# Patient Record
Sex: Male | Born: 1975 | Race: White | Hispanic: No | Marital: Married | Smoking: Never smoker
Health system: Southern US, Community
[De-identification: ages and names within clinical notes are randomized; demographics above are authoritative.]

## PROBLEM LIST (undated history)

## (undated) DIAGNOSIS — E663 Overweight: Secondary | ICD-10-CM

## (undated) DIAGNOSIS — E7800 Pure hypercholesterolemia, unspecified: Secondary | ICD-10-CM

## (undated) DIAGNOSIS — B356 Tinea cruris: Secondary | ICD-10-CM

## (undated) DIAGNOSIS — N200 Calculus of kidney: Secondary | ICD-10-CM

## (undated) DIAGNOSIS — M898X8 Other specified disorders of bone, other site: Secondary | ICD-10-CM

## (undated) HISTORY — DX: Pure hypercholesterolemia, unspecified: E78.00

## (undated) HISTORY — DX: Tinea cruris: B35.6

## (undated) HISTORY — DX: Calculus of kidney: N20.0

## (undated) HISTORY — DX: Other specified disorders of bone, other site: M89.8X8

## (undated) HISTORY — DX: Overweight: E66.3

---

## 2007-01-26 ENCOUNTER — Emergency Department: Payer: Self-pay | Admitting: Unknown Physician Specialty

## 2008-06-22 ENCOUNTER — Emergency Department: Payer: Self-pay | Admitting: Emergency Medicine

## 2009-02-26 ENCOUNTER — Emergency Department: Payer: Self-pay | Admitting: Emergency Medicine

## 2009-09-03 ENCOUNTER — Emergency Department: Payer: Self-pay | Admitting: Emergency Medicine

## 2011-08-31 ENCOUNTER — Emergency Department: Payer: Self-pay | Admitting: *Deleted

## 2012-03-18 ENCOUNTER — Emergency Department: Payer: Self-pay | Admitting: Emergency Medicine

## 2014-03-29 ENCOUNTER — Emergency Department: Payer: Self-pay | Admitting: Emergency Medicine

## 2014-06-02 ENCOUNTER — Emergency Department
Admit: 2014-06-02 | Disposition: A | Discharge status: Home / Self Care | Payer: Self-pay | Admitting: Emergency Medicine

## 2014-06-02 LAB — CBC WITH DIFFERENTIAL/PLATELET
Basophil #: 0.1 10*3/uL (ref 0.0–0.1)
Basophil %: 0.7 %
Eosinophil #: 0.1 10*3/uL (ref 0.0–0.7)
Eosinophil %: 1 %
HCT: 44.4 % (ref 40.0–52.0)
HGB: 14.9 g/dL (ref 13.0–18.0)
LYMPHS PCT: 15.1 %
Lymphocyte #: 1.6 10*3/uL (ref 1.0–3.6)
MCH: 29.7 pg (ref 26.0–34.0)
MCHC: 33.6 g/dL (ref 32.0–36.0)
MCV: 89 fL (ref 80–100)
Monocyte #: 0.6 x10 3/mm (ref 0.2–1.0)
Monocyte %: 5.8 %
Neutrophil #: 8.3 10*3/uL — ABNORMAL HIGH (ref 1.4–6.5)
Neutrophil %: 77.4 %
PLATELETS: 206 10*3/uL (ref 150–440)
RBC: 5.02 10*6/uL (ref 4.40–5.90)
RDW: 13.1 % (ref 11.5–14.5)
WBC: 10.7 10*3/uL — ABNORMAL HIGH (ref 3.8–10.6)

## 2014-06-02 LAB — URINALYSIS, COMPLETE
Bacteria: NONE SEEN
Bilirubin,UR: NEGATIVE
Blood: NEGATIVE
Glucose,UR: NEGATIVE mg/dL (ref 0–75)
Ketone: NEGATIVE
LEUKOCYTE ESTERASE: NEGATIVE
NITRITE: NEGATIVE
PH: 6 (ref 4.5–8.0)
PROTEIN: NEGATIVE
Specific Gravity: 1.023 (ref 1.003–1.030)
Squamous Epithelial: NONE SEEN

## 2014-06-02 LAB — COMPREHENSIVE METABOLIC PANEL
Albumin: 4.8 g/dL
Alkaline Phosphatase: 89 U/L
Anion Gap: 7 (ref 7–16)
BILIRUBIN TOTAL: 0.7 mg/dL
BUN: 16 mg/dL
CREATININE: 1.02 mg/dL
Calcium, Total: 9.4 mg/dL
Chloride: 107 mmol/L
Co2: 25 mmol/L
EGFR (African American): >60
Glucose: 122 mg/dL — ABNORMAL HIGH
POTASSIUM: 4.3 mmol/L
SGOT(AST): 21 U/L
SGPT (ALT): 18 U/L
Sodium: 139 mmol/L
Total Protein: 8 g/dL

## 2014-06-02 LAB — LIPASE, BLOOD: Lipase: 35 U/L

## 2014-07-09 ENCOUNTER — Encounter: Payer: Self-pay | Admitting: Emergency Medicine

## 2014-07-09 ENCOUNTER — Emergency Department
Admission: EM | Admit: 2014-07-09 | Discharge: 2014-07-09 | Disposition: A | Discharge status: Home / Self Care | Payer: BLUE CROSS/BLUE SHIELD | Attending: Emergency Medicine | Admitting: Emergency Medicine

## 2014-07-09 ENCOUNTER — Emergency Department: Payer: BLUE CROSS/BLUE SHIELD

## 2014-07-09 ENCOUNTER — Other Ambulatory Visit: Payer: Self-pay

## 2014-07-09 DIAGNOSIS — R1011 Right upper quadrant pain: Secondary | ICD-10-CM | POA: Insufficient documentation

## 2014-07-09 LAB — COMPREHENSIVE METABOLIC PANEL
ALT: 15 U/L — ABNORMAL LOW (ref 17–63)
ANION GAP: 6 (ref 5–15)
AST: 19 U/L (ref 15–41)
Albumin: 4.6 g/dL (ref 3.5–5.0)
Alkaline Phosphatase: 84 U/L (ref 38–126)
BUN: 18 mg/dL (ref 6–20)
CALCIUM: 9.3 mg/dL (ref 8.9–10.3)
CO2: 30 mmol/L (ref 22–32)
Chloride: 106 mmol/L (ref 101–111)
Creatinine, Ser: 1.24 mg/dL (ref 0.61–1.24)
GLUCOSE: 110 mg/dL — AB (ref 65–99)
Potassium: 4.7 mmol/L (ref 3.5–5.1)
Sodium: 142 mmol/L (ref 135–145)
Total Bilirubin: 0.4 mg/dL (ref 0.3–1.2)
Total Protein: 7.9 g/dL (ref 6.5–8.1)

## 2014-07-09 LAB — CBC WITH DIFFERENTIAL/PLATELET
BASOS PCT: 1 %
Basophils Absolute: 0.1 10*3/uL (ref 0–0.1)
EOS PCT: 3 %
Eosinophils Absolute: 0.2 10*3/uL (ref 0–0.7)
HEMATOCRIT: 44 % (ref 40.0–52.0)
Hemoglobin: 15 g/dL (ref 13.0–18.0)
Lymphocytes Relative: 26 %
Lymphs Abs: 2.4 10*3/uL (ref 1.0–3.6)
MCH: 30.2 pg (ref 26.0–34.0)
MCHC: 34.1 g/dL (ref 32.0–36.0)
MCV: 88.4 fL (ref 80.0–100.0)
MONO ABS: 0.9 10*3/uL (ref 0.2–1.0)
MONOS PCT: 10 %
Neutro Abs: 5.7 10*3/uL (ref 1.4–6.5)
Neutrophils Relative %: 60 %
PLATELETS: 183 10*3/uL (ref 150–440)
RBC: 4.98 MIL/uL (ref 4.40–5.90)
RDW: 13.4 % (ref 11.5–14.5)
WBC: 9.4 10*3/uL (ref 3.8–10.6)

## 2014-07-09 LAB — LIPASE, BLOOD: LIPASE: 47 U/L (ref 22–51)

## 2014-07-09 LAB — TROPONIN I

## 2014-07-09 MED ORDER — IOHEXOL 300 MG/ML  SOLN
100.0000 mL | Freq: Once | INTRAMUSCULAR | Status: AC | PRN
  Administered 2014-07-09: 100 mL via INTRAVENOUS

## 2014-07-09 MED ORDER — IOHEXOL 240 MG/ML SOLN
25.0000 mL | Freq: Once | INTRAMUSCULAR | Status: AC | PRN
  Administered 2014-07-09: 25 mL via ORAL

## 2014-07-09 MED ORDER — TRAMADOL HCL 50 MG PO TABS: 50.0000 mg | ORAL_TABLET | Freq: Four times a day (QID) | ORAL | Status: AC | PRN

## 2014-07-09 NOTE — ED Notes (Signed)
Pt drinking contrast, tolerating at this time. Will continue to monitor.

## 2014-07-09 NOTE — ED Provider Notes (Signed)
[facility] Emergency Department Provider Note  Time seen: 8:23 AM  I have reviewed the triage vital signs and the nursing notes.   HISTORY  Chief Complaint Abdominal Pain    HPI Anthony Randall is a 39 y.o. male with no past medical history who presents to the emergency department with right upper quadrant abdominal pain. According to the patient for the past 1.5 months he has been having intermittent right upper quadrant pain. Most often occurs after eating, however sometimes including overnight tonight it occurred 6-7 hours after last meal. The patient was seen here twice for this same. He states he had labs and an ultrasound which showed normal results. He denies nausea, vomiting, diarrhea, dysuria. Denies alcohol intake. Denies any worsening with spicy foods or fatty foods. Occasionally he will develop pain after eating but he will also develop pain when he doesn't eat. This morning he states was one of his worst episode so we came back to the emergency department for evaluation. He describes the pain as moderate in severity, dull/aching, located in the right upper quadrant. Occasionally worse with eating. Denies fever.     History reviewed. No pertinent past medical history.  There are no active problems to display for this patient.   History reviewed. No pertinent past surgical history.  No current outpatient prescriptions on file.  Allergies Review of patient's allergies indicates no known allergies.  No family history on file.  Social History History  Substance Use Topics  . Smoking status: Never Smoker   . Smokeless tobacco: Not on file  . Alcohol Use: Not on file    Review of Systems Constitutional: Negative for fever. Cardiovascular: Negative for chest pain. Respiratory: Negative for shortness of breath. Gastrointestinal: Positive for right upper quadrant abdominal pain. Negative for nausea/vomiting/diarrhea. Genitourinary: Negative  for dysuria. Musculoskeletal: Negative for back pain.  10-point ROS otherwise negative.  ____________________________________________   PHYSICAL EXAM:  VITAL SIGNS: ED Triage Vitals  Enc Vitals Group     BP 07/09/14 0820 135/82 mmHg     Pulse Rate 07/09/14 0740 65     Resp 07/09/14 0740 18     Temp 07/09/14 0740 98.3 F (36.8 C)     Temp Source 07/09/14 0740 Oral     SpO2 07/09/14 0740 99 %     Weight 07/09/14 0740 230 lb (104.327 kg)     Height 07/09/14 0740 6\' 2"  (1.88 m)     Head Cir --      Peak Flow --      Pain Score 07/09/14 0740 5     Pain Loc --      Pain Edu? --      Excl. in GC? --     Constitutional: Alert and oriented. Well appearing and in no distress. ENT   Head: Normocephalic and atraumatic.   Mouth/Throat: Mucous membranes are moist. Cardiovascular: Normal rate, regular rhythm. No murmurs Respiratory: Normal respiratory effort without tachypnea nor retractions. Breath sounds are clear Gastrointestinal: Soft, moderate right upper quadrant tenderness to palpation without rebound or guarding. Abdomen otherwise benign. Musculoskeletal: Nontender with normal range of motion in all extremities.  Neurologic:  Normal speech and language. No gross focal neurologic deficits  Skin:  Skin is warm, dry and intact.  Psychiatric: Mood and affect are normal. Speech and behavior are normal.   ____________________________________________    RADIOLOGY  CT abdomen/pelvis within normal limits. No acute abnormality noted.  ____________________________________________   INITIAL IMPRESSION / ASSESSMENT AND PLAN /  ED COURSE  Pertinent labs & imaging results that were available during my care of the patient were reviewed by me and considered in my medical decision making (see chart for details).  39 year old male with no past medical history 1.5 months of intermittent right upper quadrant pain worse last night. We will check labs. Labs are largely within normal  limits. Patient states he had a normal ultrasound 1 month ago and does not wish to repeat an ultrasound at this time. I discussed the risks/benefits of a CT scan, and patient wishes to proceed with a CT scan.  EKG reviewed and interpreted by myself shows normal sinus rhythm at 63 bpm, narrow QRS, normal axis, normal intervals, no ST changes noted. Overall normal EKG.  CT within normal limits, labs within normal limits. I discussed results with the patient and recommended patient follow up with GI medicine for further workup/evaluation including possible HIDA scan. Patient is agreeable to plan.  ____________________________________________   FINAL CLINICAL IMPRESSION(S) / ED DIAGNOSES  Right upper quadrant abdominal pain   Minna Antis, MD 07/09/14 1052

## 2014-07-09 NOTE — ED Notes (Signed)
Patient is resting comfortably. 

## 2014-07-09 NOTE — ED Notes (Signed)
Awoke this AM RUQ abd pain radiating to right flank, denies urinary symptoms  , hx of same approx 1 month ago , increased pain on palpation

## 2014-07-09 NOTE — Discharge Instructions (Signed)

## 2014-07-20 ENCOUNTER — Other Ambulatory Visit: Payer: Self-pay | Admitting: Unknown Physician Specialty

## 2014-07-20 DIAGNOSIS — R1011 Right upper quadrant pain: Secondary | ICD-10-CM

## 2014-07-27 ENCOUNTER — Ambulatory Visit
Admission: RE | Admit: 2014-07-27 | Discharge: 2014-07-27 | Disposition: A | Discharge status: Home / Self Care | Payer: BLUE CROSS/BLUE SHIELD | Source: Ambulatory Visit | Attending: Unknown Physician Specialty | Admitting: Unknown Physician Specialty

## 2014-07-27 DIAGNOSIS — R112 Nausea with vomiting, unspecified: Secondary | ICD-10-CM | POA: Diagnosis not present

## 2014-07-27 DIAGNOSIS — R1011 Right upper quadrant pain: Secondary | ICD-10-CM | POA: Diagnosis present

## 2014-07-27 MED ORDER — SINCALIDE 5 MCG IJ SOLR
0.0200 ug/kg | Freq: Once | INTRAMUSCULAR | Status: AC
  Administered 2014-07-27: 2.09 ug via INTRAVENOUS

## 2014-07-27 MED ORDER — SINCALIDE 5 MCG IJ SOLR
0.0200 ug/kg | Freq: Once | INTRAMUSCULAR | Status: DC
Start: 2014-07-27 — End: 2014-07-28

## 2014-07-27 MED ORDER — TECHNETIUM TC 99M MEBROFENIN IV KIT
5.0000 | PACK | Freq: Once | INTRAVENOUS | Status: AC | PRN
  Administered 2014-07-27: 4.94 via INTRAVENOUS

## 2014-08-12 HISTORY — PX: UPPER GI ENDOSCOPY: SHX6162

## 2014-08-12 HISTORY — PX: COLONOSCOPY: SHX174

## 2015-11-10 IMAGING — NM NM HEPATO W/GB/PHARM/[PERSON_NAME]
3 series · 18 of 18 positions shown · non-contrast
Comparison: None; correlation CT abdomen and pelvis 07/09/2014,
ultrasound abdomen 03/29/2014

CLINICAL DATA: RIGHT upper quadrant pain, nausea and vomiting off
and on since March 2014

EXAM:
NUCLEAR MEDICINE HEPATOBILIARY IMAGING WITH GALLBLADDER EF
TECHNIQUE: Sequential images of the abdomen were obtained [DATE] minutes
following intravenous administration of radiopharmaceutical. After
slow intravenous infusion of 2.09 micrograms Cholecystokinin,
gallbladder ejection fraction was determined.
RADIOPHARMACEUTICALS:  4.94 mCi Kechnetium-EEm Choletec IV

[Series 1000: gallbladder ef dynamic (results) · 4.80mm/px · 6 of 90 frames shown]
[frame 8/90]
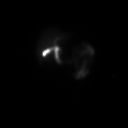
[frame 23/90]
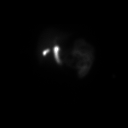
[frame 38/90]
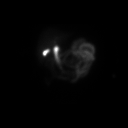
[frame 53/90]
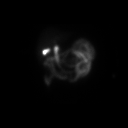
[frame 68/90]
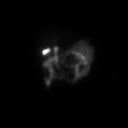
[frame 83/90]
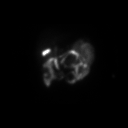

[Series 1000: gallbladder ef dynamic · 4.80mm/px · 6 of 90 frames shown]
[frame 8/90]
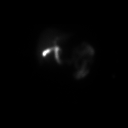
[frame 23/90]
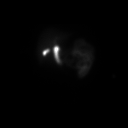
[frame 38/90]
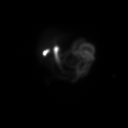
[frame 53/90]
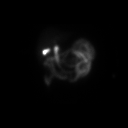
[frame 68/90]
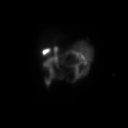
[frame 83/90]
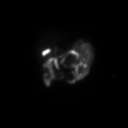

[Series 1000: hepatobiliary dynamic · 9.59mm/px · 6 of 60 frames shown]
[frame 6/60]
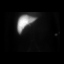
[frame 16/60]
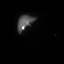
[frame 26/60]
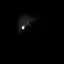
[frame 36/60]
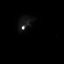
[frame 46/60]
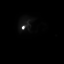
[frame 56/60]
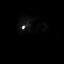

[18 of 18 positions shown; findings below may reference images not displayed]

FINDINGS: Normal tracer extraction from bloodstream indicating normal
hepatocellular function.

Normal excretion of tracer into biliary tree.

Gallbladder visualized at 12 min.

Small bowel visualized at 45 min.

No hepatic retention of tracer.

Subjectively normal emptying of tracer from gallbladder following
CCK administration.

Calculated gallbladder ejection fraction is 63%, normal.

Patient reported no symptoms following CCK administration.
IMPRESSION: Normal exam.

## 2015-12-15 ENCOUNTER — Other Ambulatory Visit: Payer: Self-pay | Admitting: Family Medicine

## 2015-12-15 DIAGNOSIS — R220 Localized swelling, mass and lump, head: Secondary | ICD-10-CM

## 2015-12-20 ENCOUNTER — Ambulatory Visit: Payer: BLUE CROSS/BLUE SHIELD

## 2015-12-21 ENCOUNTER — Ambulatory Visit
Admission: RE | Admit: 2015-12-21 | Discharge: 2015-12-21 | Disposition: A | Discharge status: Home / Self Care | Payer: BLUE CROSS/BLUE SHIELD | Source: Ambulatory Visit | Attending: Family Medicine | Admitting: Family Medicine

## 2015-12-21 DIAGNOSIS — R220 Localized swelling, mass and lump, head: Secondary | ICD-10-CM | POA: Diagnosis present

## 2018-12-01 ENCOUNTER — Other Ambulatory Visit: Payer: Self-pay

## 2018-12-01 ENCOUNTER — Ambulatory Visit: Payer: 59 | Admitting: Physician Assistant

## 2018-12-01 ENCOUNTER — Encounter: Payer: Self-pay | Admitting: Physician Assistant

## 2018-12-01 VITALS — BP 122/93 | HR 70 | Temp 97.7°F | Resp 12 | Ht 74.0 in | Wt 246.0 lb

## 2018-12-01 DIAGNOSIS — Z23 Encounter for immunization: Secondary | ICD-10-CM

## 2018-12-01 DIAGNOSIS — R21 Rash and other nonspecific skin eruption: Secondary | ICD-10-CM

## 2018-12-01 MED ORDER — TERBINAFINE HCL 1 % EX CREA: TOPICAL_CREAM | Freq: Two times a day (BID) | CUTANEOUS | Status: DC

## 2018-12-01 NOTE — Progress Notes (Signed)
Pinkish splotchy rash across chest - been there 1 1/2 -2 months. Tried hydrocortisone & it didn't do anything. States it doesn't itch.  AMD

## 2018-12-01 NOTE — Progress Notes (Signed)
   Subjective:Rash     Patient ID: Anthony Randall, male    DOB: [birth year], 43 y.o.   MRN: [id number]  HPI Patient c/o rash to right upper chest for 2 weeks. Denies itching or pain.   Review of Systems Rash Hyperlipidema     Objective:   Physical Exam Hypo-pigmented macular lesion right upper chest.       Assessment & Plan:  Tinea versicolor. Concertvative treatment consisiting of Lamisil and Selsum shampoo. F/u up 2 week if no improvement.

## 2018-12-02 ENCOUNTER — Telehealth: Payer: Self-pay | Admitting: Registered Nurse

## 2018-12-02 ENCOUNTER — Encounter: Payer: Self-pay | Admitting: Registered Nurse

## 2018-12-02 ENCOUNTER — Telehealth: Payer: Self-pay

## 2018-12-02 DIAGNOSIS — B360 Pityriasis versicolor: Secondary | ICD-10-CM

## 2018-12-02 MED ORDER — TERBINAFINE HCL 1 % EX CREA: 1.0000 "application " | TOPICAL_CREAM | Freq: Two times a day (BID) | CUTANEOUS | 0 refills | Status: DC

## 2018-12-02 NOTE — Telephone Encounter (Signed)
Spoke with Ysidro Evert.  Informed him  Malva Limes, NP-C (Interim Provider) working with [place] today checked the Rx & said it was marked as a "clinic administered medication".  She corrected the Rx & [facility] [address].    AMD

## 2018-12-02 NOTE — Telephone Encounter (Signed)
-----   Message from Madelin Headings sent at 12/02/2018 12:19 PM EDT ----- [facility] went [facility] today and they claim they still don't have the RX.

## 2018-12-02 NOTE — Telephone Encounter (Signed)
Reviewed Epic patient Rx by PA Tamala Julian sent to RN as clinic administered medication instead of to [facility] for lamisil 1% topical BID prn #[id number] RF0.  New Rx sent electronically to his pharmacy of choice [facility] [place], [place]  Please notify patient.

## 2019-09-08 ENCOUNTER — Ambulatory Visit: Payer: 59

## 2019-09-08 ENCOUNTER — Other Ambulatory Visit: Payer: Self-pay

## 2019-09-08 DIAGNOSIS — Z0000 Encounter for general adult medical examination without abnormal findings: Secondary | ICD-10-CM

## 2019-09-08 LAB — POCT URINALYSIS DIPSTICK
Bilirubin, UA: NEGATIVE
Blood, UA: NEGATIVE
Glucose, UA: NEGATIVE
Ketones, UA: NEGATIVE
Leukocytes, UA: NEGATIVE
Nitrite, UA: NEGATIVE
Protein, UA: NEGATIVE
Spec Grav, UA: 1.025 (ref 1.010–1.025)
Urobilinogen, UA: 0.2 E.U./dL
pH, UA: 6 (ref 5.0–8.0)

## 2019-09-08 NOTE — Progress Notes (Signed)
Scheduled to complete physical 09/14/19 with Dr. Williams.  AMD 

## 2019-09-09 LAB — CMP12+LP+TP+TSH+6AC+PSA+CBC…
ALT: 39 IU/L (ref 0–44)
AST: 23 IU/L (ref 0–40)
Albumin/Globulin Ratio: 1.9 (ref 1.2–2.2)
Albumin: 4.7 g/dL (ref 4.0–5.0)
Alkaline Phosphatase: 113 IU/L (ref 48–121)
BUN/Creatinine Ratio: 9 (ref 9–20)
BUN: 10 mg/dL (ref 6–24)
Basophils Absolute: 0.1 10*3/uL (ref 0.0–0.2)
Basos: 1 %
Bilirubin Total: 0.4 mg/dL (ref 0.0–1.2)
Calcium: 9.3 mg/dL (ref 8.7–10.2)
Chloride: 102 mmol/L (ref 96–106)
Chol/HDL Ratio: 4.4 ratio (ref 0.0–5.0)
Cholesterol, Total: 234 mg/dL — ABNORMAL HIGH (ref 100–199)
Creatinine, Ser: 1.14 mg/dL (ref 0.76–1.27)
EOS (ABSOLUTE): 0.3 10*3/uL (ref 0.0–0.4)
Eos: 4 %
Estimated CHD Risk: 0.9 times avg. (ref 0.0–1.0)
Free Thyroxine Index: 2 (ref 1.2–4.9)
GFR calc Af Amer: 89 mL/min/{1.73_m2} (ref >59)
GFR calc non Af Amer: 77 mL/min/{1.73_m2} (ref >59)
GGT: 16 IU/L (ref 0–65)
Globulin, Total: 2.5 g/dL (ref 1.5–4.5)
Glucose: 77 mg/dL (ref 65–99)
HDL: 53 mg/dL (ref >39)
Hematocrit: 42.1 % (ref 37.5–51.0)
Hemoglobin: 14.7 g/dL (ref 13.0–17.7)
Immature Grans (Abs): 0.1 10*3/uL (ref 0.0–0.1)
Immature Granulocytes: 1 %
Iron: 91 ug/dL (ref 38–169)
LDH: 194 IU/L (ref 121–224)
LDL Chol Calc (NIH): 163 mg/dL — ABNORMAL HIGH (ref 0–99)
Lymphocytes Absolute: 2.8 10*3/uL (ref 0.7–3.1)
Lymphs: 36 %
MCH: 30.5 pg (ref 26.6–33.0)
MCHC: 34.9 g/dL (ref 31.5–35.7)
MCV: 87 fL (ref 79–97)
Monocytes Absolute: 0.7 10*3/uL (ref 0.1–0.9)
Monocytes: 9 %
Neutrophils Absolute: 3.7 10*3/uL (ref 1.4–7.0)
Neutrophils: 49 %
Phosphorus: 2.8 mg/dL (ref 2.8–4.1)
Platelets: 182 10*3/uL (ref 150–450)
Potassium: 4.8 mmol/L (ref 3.5–5.2)
Prostate Specific Ag, Serum: 0.7 ng/mL (ref 0.0–4.0)
RBC: 4.82 x10E6/uL (ref 4.14–5.80)
RDW: 12.4 % (ref 11.6–15.4)
Sodium: 139 mmol/L (ref 134–144)
T3 Uptake Ratio: 25 % (ref 24–39)
T4, Total: 8 ug/dL (ref 4.5–12.0)
TSH: 1.72 u[IU]/mL (ref 0.450–4.500)
Total Protein: 7.2 g/dL (ref 6.0–8.5)
Triglycerides: 103 mg/dL (ref 0–149)
Uric Acid: 5.4 mg/dL (ref 3.8–8.4)
VLDL Cholesterol Cal: 18 mg/dL (ref 5–40)
WBC: 7.7 10*3/uL (ref 3.4–10.8)

## 2019-09-14 ENCOUNTER — Ambulatory Visit: Payer: Self-pay | Admitting: Emergency Medicine

## 2019-09-14 ENCOUNTER — Encounter: Payer: Self-pay | Admitting: Emergency Medicine

## 2019-09-14 ENCOUNTER — Other Ambulatory Visit: Payer: Self-pay

## 2019-09-14 VITALS — BP 119/81 | HR 64 | Temp 98.2°F | Resp 14 | Ht 73.0 in | Wt 226.0 lb

## 2019-09-14 DIAGNOSIS — Z0000 Encounter for general adult medical examination without abnormal findings: Secondary | ICD-10-CM

## 2019-09-14 DIAGNOSIS — E785 Hyperlipidemia, unspecified: Secondary | ICD-10-CM

## 2019-09-14 NOTE — Progress Notes (Signed)
Occupational Health Provider Note       Time seen: 10:13 AM    I have reviewed the vital signs and the nursing notes.  HISTORY   Chief Complaint Annual Exam   HPI Anthony Randall is a 44 y.o. male with a history of hyperlipidemia and obesity who presents today for annual physical examination.  Patient denies complaints, denies any recent illness.  He states he has lost weight recently and has increased his exercise regimen when he saw that his cholesterol was elevated.  Past Medical History:  Diagnosis Date  . Elevated LDL cholesterol level   . Kidney stone   . Overweight   . Skull mass   . Tinea cruris     Past Surgical History:  Procedure Laterality Date  . COLONOSCOPY  08/2014  . UPPER GI ENDOSCOPY  08/2014    Allergies Patient has no known allergies.   Review of Systems Constitutional: Negative for fever. Cardiovascular: Negative for chest pain. Respiratory: Negative for shortness of breath. Gastrointestinal: Negative for abdominal pain, vomiting and diarrhea. Musculoskeletal: Negative for back pain. Skin: Negative for rash. Neurological: Negative for headaches, focal weakness or numbness.  All systems negative/normal/unremarkable except as stated in the HPI  ____________________________________________   PHYSICAL EXAM:  VITAL SIGNS: Vitals:   09/14/19 1003  BP: 119/81  Pulse: 64  Resp: 14  Temp: 98.2 F (36.8 C)  SpO2: 98%    Constitutional: Alert and oriented. Well appearing and in no distress. Eyes: Conjunctivae are normal. Normal extraocular movements. ENT      Head: Normocephalic and atraumatic.      Nose: No congestion/rhinnorhea.      Mouth/Throat: Mucous membranes are moist.      Neck: No stridor. Cardiovascular: Normal rate, regular rhythm. No murmurs, rubs, or gallops. Respiratory: Normal respiratory effort without tachypnea nor retractions. Breath sounds are clear and equal bilaterally. No  wheezes/rales/rhonchi. Gastrointestinal: Soft and nontender. Normal bowel sounds Musculoskeletal: Nontender with normal range of motion in extremities. No lower extremity tenderness nor edema. Neurologic:  Normal speech and language. No gross focal neurologic deficits are appreciated.  Skin:  Skin is warm, dry and intact. No rash noted. Psychiatric: Speech and behavior are normal.  ____________________________________________  EKG: Interpreted by me.  Sinus rhythm the rate of 68 bpm, normal PR interval, normal QRS, normal QT  ____________________________________________   LABS (pertinent positives/negatives)  Recent Results (from the past 2160 hour(s))  CMP12+LP+TP+TSH+6AC+PSA+CBC.     Status: Abnormal   Collection Time: 09/08/19  9:33 AM  Result Value Ref Range   Glucose 77 65 - 99 mg/dL   Uric Acid 5.4 3.8 - 8.4 mg/dL    Comment:            Therapeutic target for gout patients: <6.0   BUN 10 6 - 24 mg/dL   Creatinine, Ser 1.14 0.76 - 1.27 mg/dL   GFR calc non Af Amer 77 >59 mL/min/1.73   GFR calc Af Amer 89 >59 mL/min/1.73    Comment: **[facility] currently reports eGFR in compliance with the current**   recommendations of the [employer]. [facility] will   update reporting as new guidelines are published from the NKF-ASN   Task force.    BUN/Creatinine Ratio 9 9 - 20   Sodium 139 134 - 144 mmol/L   Potassium 4.8 3.5 - 5.2 mmol/L   Chloride 102 96 - 106 mmol/L   Calcium 9.3 8.7 - 10.2 mg/dL   Phosphorus 2.8 2.8 - 4.1 mg/dL  Total Protein 7.2 6.0 - 8.5 g/dL   Albumin 4.7 4.0 - 5.0 g/dL   Globulin, Total 2.5 1.5 - 4.5 g/dL   Albumin/Globulin Ratio 1.9 1.2 - 2.2   Bilirubin Total 0.4 0.0 - 1.2 mg/dL   Alkaline Phosphatase 113 48 - 121 IU/L   LDH 194 121 - 224 IU/L   AST 23 0 - 40 IU/L   ALT 39 0 - 44 IU/L   GGT 16 0 - 65 IU/L   Iron 91 38 - 169 ug/dL   Cholesterol, Total 234 (H) 100 - 199 mg/dL   Triglycerides 103 0 - 149 mg/dL   HDL 53 >39 mg/dL   VLDL  Cholesterol Cal 18 5 - 40 mg/dL   LDL Chol Calc (NIH) 163 (H) 0 - 99 mg/dL   Chol/HDL Ratio 4.4 0.0 - 5.0 ratio    Comment:                                   T. Chol/HDL Ratio                                             Men  Women                               1/2 Avg.Risk  3.4    3.3                                   Avg.Risk  5.0    4.4                                2X Avg.Risk  9.6    7.1                                3X Avg.Risk 23.4   11.0    Estimated CHD Risk 0.9 0.0 - 1.0 times avg.    Comment: The CHD Risk is based on the T. Chol/HDL ratio. Other factors affect CHD Risk such as hypertension, smoking, diabetes, severe obesity, and family history of premature CHD.    TSH 1.720 0.450 - 4.500 uIU/mL   T4, Total 8.0 4.5 - 12.0 ug/dL   T3 Uptake Ratio 25 24 - 39 %   Free Thyroxine Index 2.0 1.2 - 4.9   Prostate Specific Ag, Serum 0.7 0.0 - 4.0 ng/mL    Comment: Roche ECLIA methodology. According to the American Urological Association, Serum PSA should decrease and remain at undetectable levels after radical prostatectomy. The AUA defines biochemical recurrence as an initial PSA value 0.2 ng/mL or greater followed by a subsequent confirmatory PSA value 0.2 ng/mL or greater. Values obtained with different assay methods or kits cannot be used interchangeably. Results cannot be interpreted as absolute evidence of the presence or absence of malignant disease.    WBC 7.7 3.4 - 10.8 x10E3/uL   RBC 4.82 4.14 - 5.80 x10E6/uL   Hemoglobin 14.7 13.0 - 17.7 g/dL   Hematocrit 42.1 37.5 - 51.0 %   MCV 87 79 - 97 fL  MCH 30.5 26.6 - 33.0 pg   MCHC 34.9 31 - 35 g/dL   RDW 12.4 11.6 - 15.4 %   Platelets 182 150 - 450 x10E3/uL   Neutrophils 49 Not Estab. %   Lymphs 36 Not Estab. %   Monocytes 9 Not Estab. %   Eos 4 Not Estab. %   Basos 1 Not Estab. %   Neutrophils Absolute 3.7 1 - 7 x10E3/uL   Lymphocytes Absolute 2.8 0 - 3 x10E3/uL   Monocytes Absolute 0.7 0 - 0 x10E3/uL   EOS  (ABSOLUTE) 0.3 0.0 - 0.4 x10E3/uL   Basophils Absolute 0.1 0 - 0 x10E3/uL   Immature Granulocytes 1 Not Estab. %   Immature Grans (Abs) 0.1 0.0 - 0.1 x10E3/uL  POCT urinalysis dipstick     Status: Normal   Collection Time: 09/08/19 10:03 AM  Result Value Ref Range   Color, UA yellow    Clarity, UA clear    Glucose, UA Negative Negative   Bilirubin, UA negative    Ketones, UA negative    Spec Grav, UA 1.025 1.010 - 1.025   Blood, UA negative    pH, UA 6.0 5.0 - 8.0   Protein, UA Negative Negative   Urobilinogen, UA 0.2 0.2 or 1.0 E.U./dL   Nitrite, UA negative    Leukocytes, UA Negative Negative   Appearance dark    Odor      ASSESSMENT AND PLAN  Annual physical examination, hyperlipidemia   Plan: The patient had presented for annual physical examination. Patient's labs were unremarkable with exception of mildly elevated cholesterol.  Patient has increased his diet and exercise regimen, rather than start him on medication, we will continue to monitor.  Lenise Arena MD    Note: This note was generated in part or whole with voice recognition software. Voice recognition is usually quite accurate but there are transcription errors that can and very often do occur. I apologize for any typographical errors that were not detected and corrected.

## 2019-11-02 ENCOUNTER — Other Ambulatory Visit: Payer: Self-pay

## 2019-11-02 DIAGNOSIS — Z1152 Encounter for screening for COVID-19: Secondary | ICD-10-CM

## 2019-11-02 NOTE — Progress Notes (Signed)
Presents to [employer] for outdoor specimen collection for covid testing.  Asymptomatic  Known exposure - son tested positive  Vaccinated  Has mychart  AMD

## 2019-11-04 LAB — SARS-COV-2, NAA 2 DAY TAT

## 2019-11-04 LAB — NOVEL CORONAVIRUS, NAA: SARS-CoV-2, NAA: NOT DETECTED

## 2019-12-22 ENCOUNTER — Other Ambulatory Visit: Payer: Self-pay

## 2019-12-22 NOTE — Progress Notes (Signed)
[facility] specimen ID: [id number]

## 2020-03-09 ENCOUNTER — Encounter: Payer: Self-pay | Admitting: Adult Medicine

## 2020-03-09 ENCOUNTER — Other Ambulatory Visit: Payer: Self-pay

## 2020-03-09 ENCOUNTER — Ambulatory Visit: Payer: Self-pay | Admitting: Adult Medicine

## 2020-03-09 VITALS — BP 120/81 | HR 70 | Temp 98.4°F | Resp 14 | Ht 73.0 in | Wt 225.0 lb

## 2020-03-09 DIAGNOSIS — L700 Acne vulgaris: Secondary | ICD-10-CM

## 2020-03-09 DIAGNOSIS — R21 Rash and other nonspecific skin eruption: Secondary | ICD-10-CM

## 2020-03-09 NOTE — Progress Notes (Signed)
   Subjective:    Patient ID: Anthony Randall, male    DOB: Jun 28, 1975, 45 y.o.   MRN: [id number]  HPI 43yr hx pimple on cheek which occasional get white head and resolves Father had basal cell and client is concerned   Review of Systems Noncontributory with nl 9 system review    Objective:   Physical Exam unremarkable Normotensive O2 sat heent wnl Focus exam skin facial beard Rt cheek 2-22mm red pimple  Pulm clear to ap Cardia nsr no murmer abd soft bs+ no organomegaly Ext full function all limbs Neuro grossly intact      Assessment & Plan:  Bacitracin/zinc, hydrocortisone .5% Rx packet gn. apply thin coat twice daily after cleansing with peroxide 3% daily hx of t. versicolar instructed to wash hand avoid scratching site  observe will Consider dermatology evaluation only if character  changes or non resolution

## 2020-05-12 ENCOUNTER — Other Ambulatory Visit: Payer: Self-pay

## 2020-05-12 ENCOUNTER — Ambulatory Visit: Payer: Self-pay

## 2020-05-12 DIAGNOSIS — Z01818 Encounter for other preprocedural examination: Secondary | ICD-10-CM

## 2020-05-12 LAB — POCT URINALYSIS DIPSTICK
Bilirubin, UA: NEGATIVE
Blood, UA: NEGATIVE
Glucose, UA: NEGATIVE
Ketones, UA: NEGATIVE
Leukocytes, UA: NEGATIVE
Nitrite, UA: NEGATIVE
Protein, UA: POSITIVE — AB
Spec Grav, UA: 1.015
Urobilinogen, UA: 0.2 U/dL
pH, UA: 7

## 2020-05-12 NOTE — Progress Notes (Signed)
Pt scheduled to complete physical 05/17/20

## 2020-05-13 LAB — CMP12+LP+TP+TSH+6AC+PSA+CBC…
ALT: 25 IU/L (ref 0–44)
AST: 28 IU/L (ref 0–40)
Albumin/Globulin Ratio: 1.9 (ref 1.2–2.2)
Albumin: 4.7 g/dL (ref 4.0–5.0)
Alkaline Phosphatase: 110 IU/L (ref 44–121)
BUN/Creatinine Ratio: 12 (ref 9–20)
BUN: 15 mg/dL (ref 6–24)
Basophils Absolute: 0.1 10*3/uL (ref 0.0–0.2)
Basos: 1 %
Bilirubin Total: 0.5 mg/dL (ref 0.0–1.2)
Calcium: 9.4 mg/dL (ref 8.7–10.2)
Chloride: 102 mmol/L (ref 96–106)
Chol/HDL Ratio: 4.3 ratio (ref 0.0–5.0)
Cholesterol, Total: 220 mg/dL — ABNORMAL HIGH (ref 100–199)
Creatinine, Ser: 1.28 mg/dL — ABNORMAL HIGH (ref 0.76–1.27)
EOS (ABSOLUTE): 0.3 10*3/uL (ref 0.0–0.4)
Eos: 4 %
Estimated CHD Risk: 0.8 times avg. (ref 0.0–1.0)
Free Thyroxine Index: 2.1 (ref 1.2–4.9)
GGT: 16 IU/L (ref 0–65)
Globulin, Total: 2.5 g/dL (ref 1.5–4.5)
Glucose: 95 mg/dL (ref 65–99)
HDL: 51 mg/dL (ref >39)
Hematocrit: 42.5 % (ref 37.5–51.0)
Hemoglobin: 14.4 g/dL (ref 13.0–17.7)
Immature Grans (Abs): 0 10*3/uL (ref 0.0–0.1)
Immature Granulocytes: 0 %
Iron: 117 ug/dL (ref 38–169)
LDH: 187 IU/L (ref 121–224)
LDL Chol Calc (NIH): 152 mg/dL — ABNORMAL HIGH (ref 0–99)
Lymphocytes Absolute: 2.7 10*3/uL (ref 0.7–3.1)
Lymphs: 35 %
MCH: 29.9 pg (ref 26.6–33.0)
MCHC: 33.9 g/dL (ref 31.5–35.7)
MCV: 88 fL (ref 79–97)
Monocytes Absolute: 0.8 10*3/uL (ref 0.1–0.9)
Monocytes: 10 %
Neutrophils Absolute: 3.8 10*3/uL (ref 1.4–7.0)
Neutrophils: 50 %
Phosphorus: 3.2 mg/dL (ref 2.8–4.1)
Platelets: 180 10*3/uL (ref 150–450)
Potassium: 4.3 mmol/L (ref 3.5–5.2)
Prostate Specific Ag, Serum: 0.6 ng/mL (ref 0.0–4.0)
RBC: 4.82 x10E6/uL (ref 4.14–5.80)
RDW: 12.4 % (ref 11.6–15.4)
Sodium: 139 mmol/L (ref 134–144)
T3 Uptake Ratio: 24 % (ref 24–39)
T4, Total: 8.6 ug/dL (ref 4.5–12.0)
TSH: 1.33 u[IU]/mL (ref 0.450–4.500)
Total Protein: 7.2 g/dL (ref 6.0–8.5)
Triglycerides: 93 mg/dL (ref 0–149)
Uric Acid: 5.3 mg/dL (ref 3.8–8.4)
VLDL Cholesterol Cal: 17 mg/dL (ref 5–40)
WBC: 7.6 10*3/uL (ref 3.4–10.8)
eGFR: 70 mL/min/{1.73_m2} (ref >59)

## 2020-05-17 ENCOUNTER — Ambulatory Visit: Payer: Self-pay | Admitting: Emergency Medicine

## 2020-05-17 ENCOUNTER — Other Ambulatory Visit: Payer: Self-pay

## 2020-05-17 ENCOUNTER — Encounter: Payer: Self-pay | Admitting: Emergency Medicine

## 2020-05-17 ENCOUNTER — Ambulatory Visit
Admission: RE | Admit: 2020-05-17 | Discharge: 2020-05-17 | Disposition: A | Discharge status: Home / Self Care | Payer: 59 | Source: Ambulatory Visit | Attending: Emergency Medicine | Admitting: Emergency Medicine

## 2020-05-17 VITALS — BP 127/79 | HR 85 | Temp 97.9°F | Resp 14 | Ht 73.0 in | Wt 230.0 lb

## 2020-05-17 DIAGNOSIS — Z0000 Encounter for general adult medical examination without abnormal findings: Secondary | ICD-10-CM

## 2020-05-17 DIAGNOSIS — N50812 Left testicular pain: Secondary | ICD-10-CM | POA: Insufficient documentation

## 2020-05-17 DIAGNOSIS — I861 Scrotal varices: Secondary | ICD-10-CM | POA: Diagnosis not present

## 2020-05-17 NOTE — Progress Notes (Signed)
I have reviewed the triage vital signs and the nursing notes.   HISTORY  Chief Complaint Annual Exam   HPI Anthony Randall is a 45 y.o. male presents for an annual physical.  Patient has a history of various cancers in his family including throat cancer, lung cancer and prostate cancer.  He reports that while at the gym he had an episode of less testicular pain posteriorly.  He denies any swelling or persistent pain.  He denies any dysuria or previous history of hernias.  PSAs were reviewed.      Past Medical History:  Diagnosis Date  . Elevated LDL cholesterol level   . Kidney stone   . Overweight   . Skull mass   . Tinea cruris     Patient Active Problem List   Diagnosis Date Noted  . Rash 12/01/2018    Past Surgical History:  Procedure Laterality Date  . COLONOSCOPY  08/2014  . UPPER GI ENDOSCOPY  08/2014    Prior to Admission medications   Medication Sig Start Date End Date Taking? Authorizing Provider  terbinafine (LAMISIL AT) 1 % cream Apply 1 application topically 2 (two) times daily. 12/02/18  Yes Betancourt, Jarold Song, NP    Allergies Patient has no known allergies.  History reviewed. No pertinent family history.  Social History Social History   Tobacco Use  . Smoking status: Never Smoker  . Smokeless tobacco: Never Used  Substance Use Topics  . Alcohol use: Yes    Comment: ocassionally  . Drug use: Never    Review of Systems Constitutional: No fever/chills Eyes: No visual changes. ENT: No complaints. Cardiovascular: Denies chest pain. Respiratory: Denies shortness of breath. Gastrointestinal: No abdominal pain.  No nausea, no vomiting.  No diarrhea.  No constipation. Genitourinary: Negative for dysuria.  Positive for isolated left testicular pain. Musculoskeletal: Negative for musculoskeletal pain. Skin: Negative for rash. Neurological: Negative for headaches, focal weakness or  numbness. ____________________________________________   PHYSICAL EXAM:  VITAL SIGNS: Constitutional: Alert and oriented. Well appearing and in no acute distress. Eyes: Conjunctivae are normal. PERRL. EOMI. Head: Atraumatic. Nose: No congestion/rhinnorhea. Neck: No stridor.  No cervical tenderness on palpation posteriorly.  Range of motion without restriction. Cardiovascular: Normal rate, regular rhythm. Grossly normal heart sounds.  Good peripheral circulation. Respiratory: Normal respiratory effort.  No retractions. Lungs CTAB. Gastrointestinal: Soft and nontender. No distention.  Bowel sounds normoactive x4 quadrants. Musculoskeletal: Nontender thoracic and lumbar spine.  Patient is able move upper and lower extremities without any difficulty.  No edema noted lower extremities.  Good muscle strength bilaterally.  Normal gait was noted. Neurologic:  Normal speech and language. No gross focal neurologic deficits are appreciated. No gait instability. Skin:  Skin is warm, dry and intact. No rash noted. Psychiatric: Mood and affect are normal. Speech and behavior are normal.  ____________________________________________   LABS (all labs ordered are listed, but only abnormal results are displayed)  Labs were reviewed. ____________________________________________  EKG  EKG normal sinus rhythm with a ventricular rate 71. ____________________________________________    INITIAL IMPRESSION / ASSESSMENT AND PLAN   Isolated episode of left testicular pain.  Positive family history of various cancers.  Will obtain a ultrasound scrotum with Doppler to evaluate and reassure patient. ____________________________________________   FINAL CLINICAL IMPRESSION(S)   Annual physical. History of isolated episode of left testicular pain to be evaluated.   ED Discharge Orders    None       Note:  This document was prepared using  [employer] and may include  unintentional dictation errors.

## 2020-05-22 ENCOUNTER — Other Ambulatory Visit: Payer: Self-pay

## 2020-05-22 NOTE — Progress Notes (Signed)
Lab [employer] ID: [id number]

## 2021-05-17 ENCOUNTER — Ambulatory Visit: Payer: Self-pay

## 2021-05-17 DIAGNOSIS — Z0000 Encounter for general adult medical examination without abnormal findings: Secondary | ICD-10-CM

## 2021-05-17 LAB — POCT URINALYSIS DIPSTICK
Bilirubin, UA: NEGATIVE
Blood, UA: NEGATIVE
Glucose, UA: NEGATIVE
Ketones, UA: NEGATIVE
Leukocytes, UA: NEGATIVE
Nitrite, UA: NEGATIVE
Protein, UA: NEGATIVE
Spec Grav, UA: 1.02 (ref 1.010–1.025)
Urobilinogen, UA: 0.2 E.U./dL
pH, UA: 6.5 (ref 5.0–8.0)

## 2021-05-17 NOTE — Progress Notes (Signed)
Pt presents today for physical labs, will return to clinic for scheduled physical.  

## 2021-05-18 LAB — CMP12+LP+TP+TSH+6AC+PSA+CBC…
ALT: 21 IU/L (ref 0–44)
AST: 24 IU/L (ref 0–40)
Albumin/Globulin Ratio: 1.9 (ref 1.2–2.2)
Albumin: 5.1 g/dL — ABNORMAL HIGH (ref 4.0–5.0)
Alkaline Phosphatase: 105 IU/L (ref 44–121)
BUN/Creatinine Ratio: 12 (ref 9–20)
BUN: 13 mg/dL (ref 6–24)
Basophils Absolute: 0.1 10*3/uL (ref 0.0–0.2)
Basos: 1 %
Bilirubin Total: 0.6 mg/dL (ref 0.0–1.2)
Calcium: 10.3 mg/dL — ABNORMAL HIGH (ref 8.7–10.2)
Chloride: 103 mmol/L (ref 96–106)
Chol/HDL Ratio: 4.7 ratio (ref 0.0–5.0)
Cholesterol, Total: 242 mg/dL — ABNORMAL HIGH (ref 100–199)
Creatinine, Ser: 1.13 mg/dL (ref 0.76–1.27)
EOS (ABSOLUTE): 0.3 10*3/uL (ref 0.0–0.4)
Eos: 4 %
Estimated CHD Risk: 1 times avg. (ref 0.0–1.0)
Free Thyroxine Index: 2 (ref 1.2–4.9)
GGT: 15 IU/L (ref 0–65)
Globulin, Total: 2.7 g/dL (ref 1.5–4.5)
Glucose: 107 mg/dL — ABNORMAL HIGH (ref 70–99)
HDL: 51 mg/dL (ref >39)
Hematocrit: 42 % (ref 37.5–51.0)
Hemoglobin: 14.4 g/dL (ref 13.0–17.7)
Immature Grans (Abs): 0 10*3/uL (ref 0.0–0.1)
Immature Granulocytes: 0 %
Iron: 123 ug/dL (ref 38–169)
LDH: 191 IU/L (ref 121–224)
LDL Chol Calc (NIH): 176 mg/dL — ABNORMAL HIGH (ref 0–99)
Lymphocytes Absolute: 2.5 10*3/uL (ref 0.7–3.1)
Lymphs: 38 %
MCH: 29.9 pg (ref 26.6–33.0)
MCHC: 34.3 g/dL (ref 31.5–35.7)
MCV: 87 fL (ref 79–97)
Monocytes Absolute: 0.7 10*3/uL (ref 0.1–0.9)
Monocytes: 11 %
Neutrophils Absolute: 3.1 10*3/uL (ref 1.4–7.0)
Neutrophils: 46 %
Phosphorus: 3.6 mg/dL (ref 2.8–4.1)
Platelets: 191 10*3/uL (ref 150–450)
Potassium: 4.6 mmol/L (ref 3.5–5.2)
Prostate Specific Ag, Serum: 0.5 ng/mL (ref 0.0–4.0)
RBC: 4.81 x10E6/uL (ref 4.14–5.80)
RDW: 12.6 % (ref 11.6–15.4)
Sodium: 144 mmol/L (ref 134–144)
T3 Uptake Ratio: 25 % (ref 24–39)
T4, Total: 8.1 ug/dL (ref 4.5–12.0)
TSH: 2.27 u[IU]/mL (ref 0.450–4.500)
Total Protein: 7.8 g/dL (ref 6.0–8.5)
Triglycerides: 84 mg/dL (ref 0–149)
Uric Acid: 5.7 mg/dL (ref 3.8–8.4)
VLDL Cholesterol Cal: 15 mg/dL (ref 5–40)
WBC: 6.7 10*3/uL (ref 3.4–10.8)
eGFR: 81 mL/min/{1.73_m2} (ref >59)

## 2021-05-24 ENCOUNTER — Ambulatory Visit: Payer: Self-pay | Admitting: Physician Assistant

## 2021-05-24 ENCOUNTER — Encounter: Payer: Self-pay | Admitting: Physician Assistant

## 2021-05-24 VITALS — BP 140/87 | HR 84 | Temp 98.4°F | Resp 14 | Ht 73.0 in | Wt 243.0 lb

## 2021-05-24 DIAGNOSIS — Z0000 Encounter for general adult medical examination without abnormal findings: Secondary | ICD-10-CM

## 2021-05-24 DIAGNOSIS — E785 Hyperlipidemia, unspecified: Secondary | ICD-10-CM

## 2021-05-24 MED ORDER — ATORVASTATIN CALCIUM 40 MG PO TABS: 40.0000 mg | ORAL_TABLET | Freq: Every day | ORAL | 3 refills | Status: DC

## 2021-05-24 NOTE — Progress Notes (Signed)
?[facility] ? ? ? ? ?____________________________________________ ? ? None  ?  (approximate) ? ?I have reviewed the triage vital signs and the nursing notes. ? ? ?HISTORY ? ?Chief Complaint ?Annual Exam ? ? ? ?HPI ?Anthony Randall is a 46 y.o. male patient presents annual physical exam.  Patient voiced concern for intimating left lower back pain.  Denies radicular component to back pain.  Denies bladder or bowel dysfunction.  Patient has history of elevated LDL cholesterol levels. ?   ? ?  ? ? ?Past Medical History:  ?Diagnosis Date  ? Elevated LDL cholesterol level   ? Kidney stone   ? Overweight   ? Skull mass   ? Tinea cruris   ? ? ?Patient Active Problem List  ? Diagnosis Date Noted  ? Rash 12/01/2018  ? ? ?Past Surgical History:  ?Procedure Laterality Date  ? COLONOSCOPY  08/2014  ? UPPER GI ENDOSCOPY  08/2014  ? ? ?Prior to Admission medications   ?Medication Sig Start Date End Date Taking? Authorizing Provider  ?terbinafine (LAMISIL AT) 1 % cream Apply 1 application topically 2 (two) times daily. 12/02/18  Yes Betancourt, Aura Fey, NP  ? ? ?Allergies ?Patient has no known allergies. ? ?History reviewed. No pertinent family history. ? ?Social History ?Social History  ? ?Tobacco Use  ? Smoking status: Never  ? Smokeless tobacco: Never  ?Substance Use Topics  ? Alcohol use: Yes  ?  Comment: ocassionally  ? Drug use: Never  ? ? ?Review of Systems ?Constitutional: No fever/chills ?Eyes: No visual changes. ?ENT: No sore throat. ?Cardiovascular: Denies chest pain. ?Respiratory: Denies shortness of breath. ?Gastrointestinal: No abdominal pain.  No nausea, no vomiting.  No diarrhea.  No constipation. ?Genitourinary: Negative for dysuria. ?Musculoskeletal: Positive for back pain. ?Skin: Negative for rash. ?Neurological: Negative for headaches, focal weakness or numbness. ?____________________________________________ ? ? ?PHYSICAL EXAM: ? ?VITAL SIGNS: Temperature 98.4, respiration 14, pulse  84, BP is 140/87, and patient is 96% O2 sat on room air.  Patient weighs 243 pounds and BMI is 32.06. ?Constitutional: Alert and oriented. Well appearing and in no acute distress. ?Eyes: Conjunctivae are normal. PERRL. EOMI. ?Head: Atraumatic. ?Nose: No congestion/rhinnorhea. ?Mouth/Throat: Mucous membranes are moist.  Oropharynx non-erythematous. ?Neck: No stridor.  No cervical spine tenderness to palpation. ?Hematological/Lymphatic/Immunilogical: No cervical lymphadenopathy. ?Cardiovascular: Normal rate, regular rhythm. Grossly normal heart sounds.  Good peripheral circulation. ?Respiratory: Normal respiratory effort.  No retractions. Lungs CTAB. ?Gastrointestinal: Soft and nontender. No distention. No abdominal bruits. No CVA tenderness. ?Genitourinary: Deferred ?Musculoskeletal: No lower extremity tenderness nor edema.  No joint effusions. ?Neurologic:  Normal speech and language. No gross focal neurologic deficits are appreciated. No gait instability. ?Skin:  Skin is warm, dry and intact. No rash noted. ?Psychiatric: Mood and affect are normal. Speech and behavior are normal. ? ?____________________________________________ ?  ?LABS ? ?___ ?      ?Component Ref Range & Units 7 d ago ?(05/17/21) 1 yr ago ?(05/12/20) 1 yr ago ?(09/08/19)  ?Color, UA  Light Yellow  yellow  yellow   ?Clarity, UA  Clear  clear  clear   ?Glucose, UA Negative Negative  Negative  Negative   ?Bilirubin, UA  Negative  negative  negative   ?Ketones, UA  Negative  negative  negative   ?Spec Grav, UA 1.010 - 1.025 1.020  1.015  1.025   ?Blood, UA  Negative  negative  negative   ?pH, UA 5.0 - 8.0 6.5  7.0  6.0   ?  Protein, UA Negative Negative  Positive Abnormal  CM  Negative   ?Urobilinogen, UA 0.2 or 1.0 E.U./dL 0.2  0.2  0.2   ?Nitrite, UA  Negative  negative  negative   ?Leukocytes, UA Negative Negative  Negative  Negative   ?Appearance   light  dark   ?Odor        ?  ? ?  ?  ?Specimen Collected: 05/17/21 09:11 Last Resulted: 05/17/21 09:11  ?   ?  Lab Flowsheet   ? Order Details   ? View Encounter   ? Lab and Collection Details   ? Routing   ? Result History    ?View Encounter Conversation    ?  ?CM=Additional comments    ?  ?Result Care Coordination ? ? ?Patient Communication ? ? Add Comments   Seen Back to Top  ?  ?  ? ?Other Results from 05/17/2021 ? ? Contains abnormal data CMP12+LP+TP+TSH+6AC+PSA+CBC? ?Order: [id number] ?Status: Final result    ?Visible to patient: Yes (seen)    ?Next appt: None    ?Dx: Routine adult health maintenance    ?0 Result Notes ?          ?Component Ref Range & Units 7 d ago ?(05/17/21) 1 yr ago ?(05/12/20) 1 yr ago ?(09/08/19) 6 yr ago ?(07/09/14) 6 yr ago ?(07/09/14) 6 yr ago ?(06/02/14) 6 yr ago ?(06/02/14)  ?Glucose 70 - 99 mg/dL 107 High   95 R  77 R  110 High  R   122 High  R, CM    ?Uric Acid 3.8 - 8.4 mg/dL 5.7  5.3 CM  5.4 CM       ?Comment:            Therapeutic target for gout patients: <6.0  ?BUN 6 - 24 mg/dL 13  15  10  18 R   16 R, CM    ?Creatinine, Ser 0.76 - 1.27 mg/dL 1.13  1.28 High   1.14  1.24 R   1.02 R, CM    ?eGFR >59 mL/min/1.73 81  70        ?BUN/Creatinine Ratio 9 - 20 12  12  9       ?Sodium 134 - 144 mmol/L 144  139  139  142 R   139 R, CM    ?Potassium 3.5 - 5.2 mmol/L 4.6  4.3  4.8  4.7 R   4.3 R, CM    ?Chloride 96 - 106 mmol/L 103  102  102  106 R   107 R, CM    ?Calcium 8.7 - 10.2 mg/dL 10.3 High   9.4  9.3  9.3 R   9.4 R, CM    ?Phosphorus 2.8 - 4.1 mg/dL 3.6  3.2  2.8       ?Total Protein 6.0 - 8.5 g/dL 7.8  7.2  7.2  7.9 R   8.0 R, CM    ?Albumin 4.0 - 5.0 g/dL 5.1 High   4.7  4.7  4.6 R   4.8 R, CM    ?Globulin, Total 1.5 - 4.5 g/dL 2.7  2.5  2.5       ?Albumin/Globulin Ratio 1.2 - 2.2 1.9  1.9  1.9       ?Bilirubin Total 0.0 - 1.2 mg/dL 0.6  0.5  0.4  0.4 R   0.7 R, CM    ?Alkaline Phosphatase 44 - 121 IU/L 105  110  113 R  84 R   89   R, CM    ?LDH 121 - 224 IU/L 191  187  194       ?AST 0 - 40 IU/L 24  28  23  19 R   21 R, CM    ?ALT 0 - 44 IU/L 21  25  39  15 Low  R   18 R, CM    ?GGT 0 - 65  IU/L 15  16  16       ?Iron 38 - 169 ug/dL 123  117  91       ?Cholesterol, Total 100 - 199 mg/dL 242 High   220 High   234 High        ?Triglycerides 0 - 149 mg/dL 84  93  103       ?HDL >39 mg/dL 51  51  53       ?VLDL Cholesterol Cal 5 - 40 mg/dL 15  17  18       ?LDL Chol Calc (NIH) 0 - 99 mg/dL 176 High   152 High   163 High        ?Chol/HDL Ratio 0.0 - 5.0 ratio 4.7  4.3 CM  4.4 CM       ?Comment:                                   T. Chol/HDL Ratio  ?                                            Men  Women  ?                              1/2 Avg.Risk  3.4    3.3  ?                                  Avg.Risk  5.0    4.4  ?                               2X Avg.Risk  9.6    7.1  ?                               3X Avg.Risk 23.4   11.0   ?Estimated CHD Risk 0.0 - 1.0 times avg. 1.0  0.8 CM  0.9 CM       ?Comment: The CHD Risk is based on the T. Chol/HDL ratio. Other  ?factors affect CHD Risk such as hypertension, smoking,  ?diabetes, severe obesity, and family history of  ?premature CHD.   ?TSH 0.450 - 4.500 uIU/mL 2.270  1.330  1.720       ?T4, Total 4.5 - 12.0 ug/dL 8.1  8.6  8.0       ?T3 Uptake Ratio 24 - 39 % 25  24  25       ?Free Thyroxine Index 1.2 - 4.9 2.0  2.1  2.0       ?Prostate Specific Ag, Serum 0.0 - 4.0 ng/mL 0.5  0.6 CM  0.7 CM       ?  Comment: Roche ECLIA methodology.  ?According to the American Urological Association, Serum PSA should  ?decrease and remain at undetectable levels after radical  ?prostatectomy. The AUA defines biochemical recurrence as an initial  ?PSA value 0.2 ng/mL or greater followed by a subsequent confirmatory  ?PSA value 0.2 ng/mL or greater.  ?Values obtained with different assay methods or kits cannot be used  ?interchangeably. Results cannot be interpreted as absolute evidence  ?of the presence or absence of malignant disease.   ?WBC 3.4 - 10.8 x10E3/uL 6.7  7.6  7.7   9.4 R   10.7 High  R   ?RBC 4.14 - 5.80 x10E6/uL 4.81  4.82  4.82   4.98 R   5.02 R   ?Hemoglobin 13.0 -  17.7 g/dL 14.4  14.4  14.7   15.0 R     ?Hematocrit 37.5 - 51.0 % 42.0  42.5  42.1   44.0 R     ?MCV 79 - 97 fL 87  88  87   88.4 R   89 R   ?MCH 26.6 - 33.0 pg 29.9  29.9  30.5   30.2 R   29.7 R   ?MCHC 3

## 2021-05-24 NOTE — Progress Notes (Signed)
Pt presents today to complete physical. Pt denies any issues or concerns at this time./CL,RMA 

## 2021-07-20 DIAGNOSIS — H524 Presbyopia: Secondary | ICD-10-CM | POA: Diagnosis not present

## 2021-08-31 IMAGING — US US SCROTUM W/ DOPPLER COMPLETE
1 series · 14 of 25 positions shown · non-contrast
Comparison: None.

CLINICAL DATA: Acute left testicular pain.

EXAM:
SCROTAL ULTRASOUND
DOPPLER ULTRASOUND OF THE TESTICLES
TECHNIQUE: Complete ultrasound examination of the testicles, epididymis, and
other scrotal structures was performed. Color and spectral Doppler
ultrasound were also utilized to evaluate blood flow to the
testicles.

[Series 1: us scrotum w/ doppler complete · 0.08mm/px · 14 of 51 slices shown]
[im 1/51]
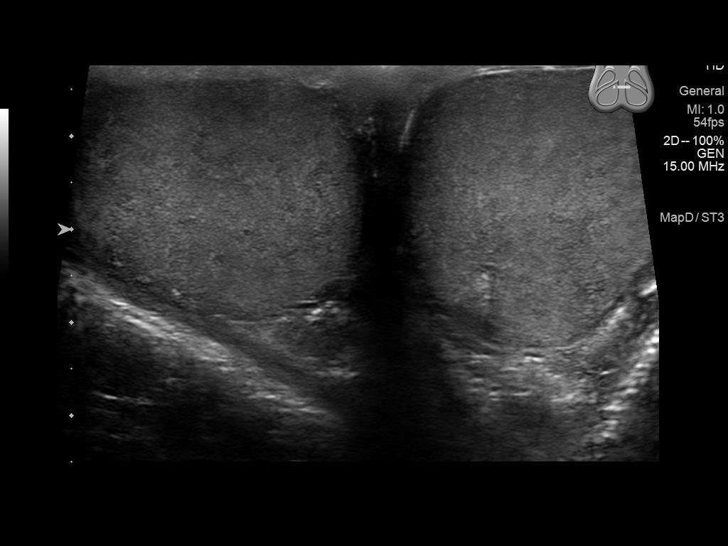
[im 5/51]
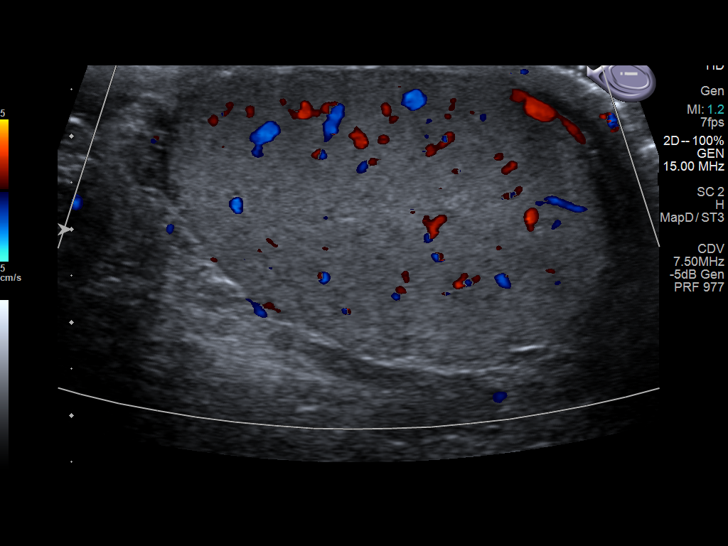
[im 9/51]
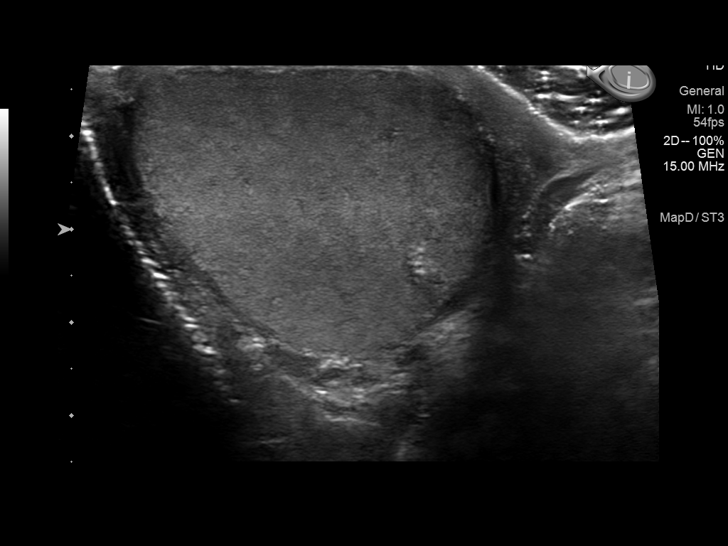
[im 13/51]
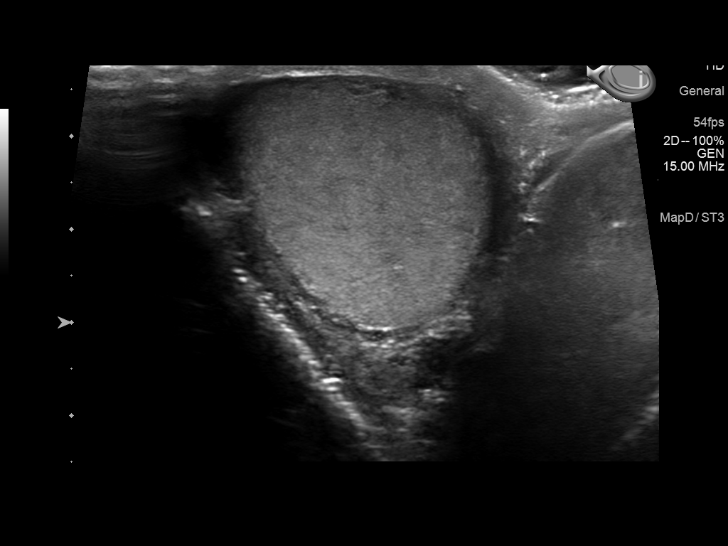
[im 17/51]
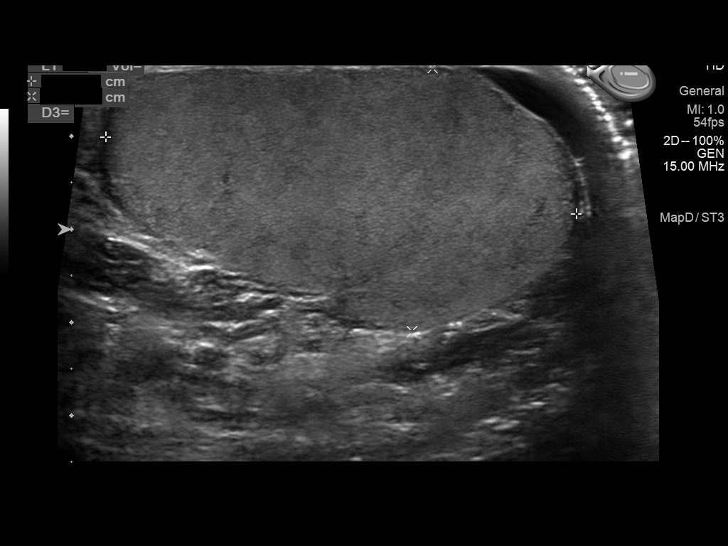
[im 19/51]
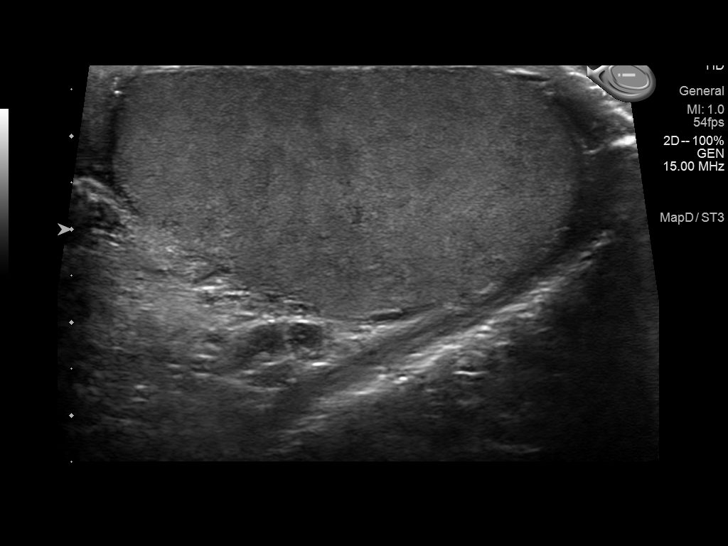
[im 23/51]
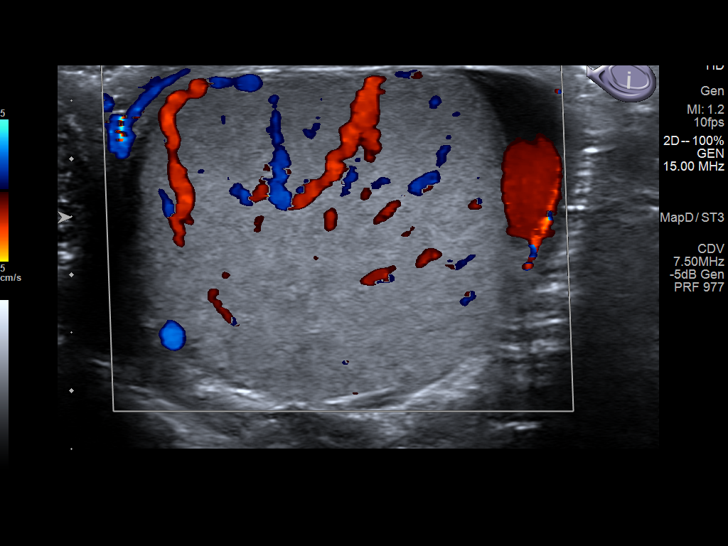
[im 28/51]
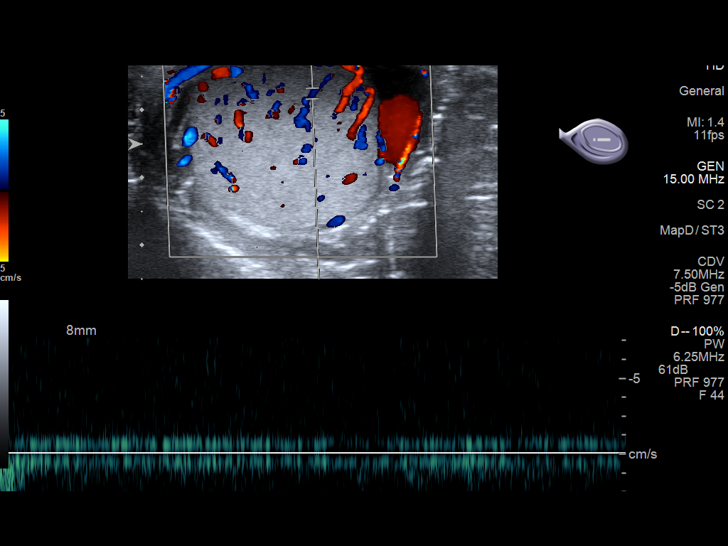
[im 32/51]
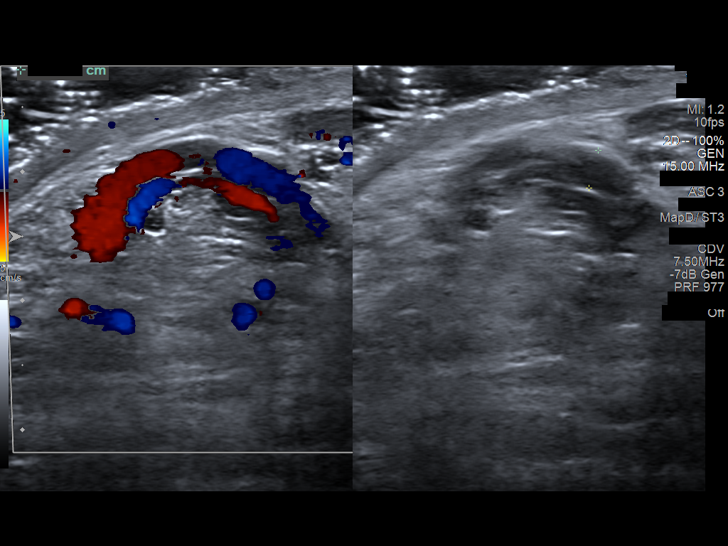
[im 34/51]
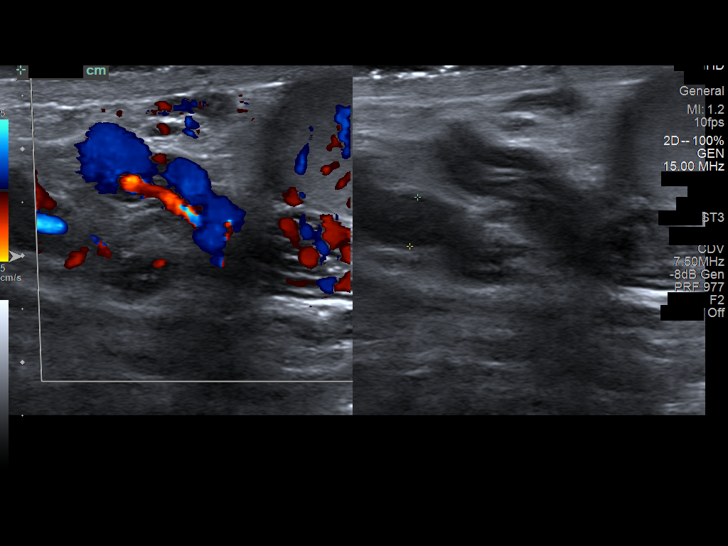
[im 38/51]
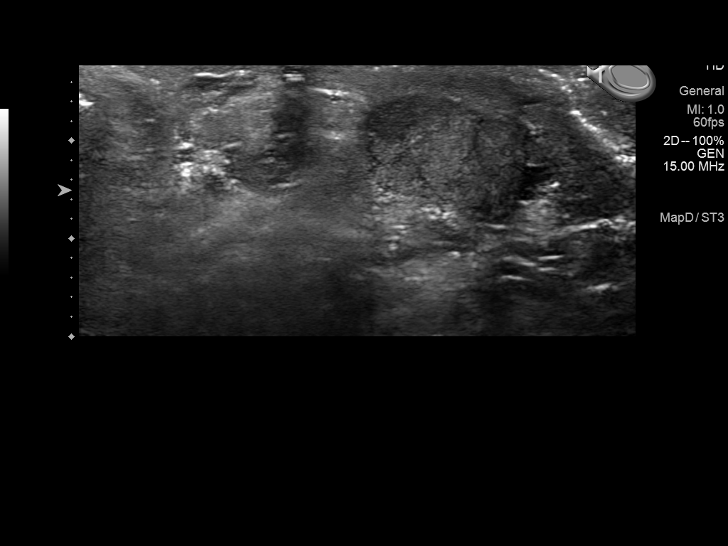
[im 42/51]
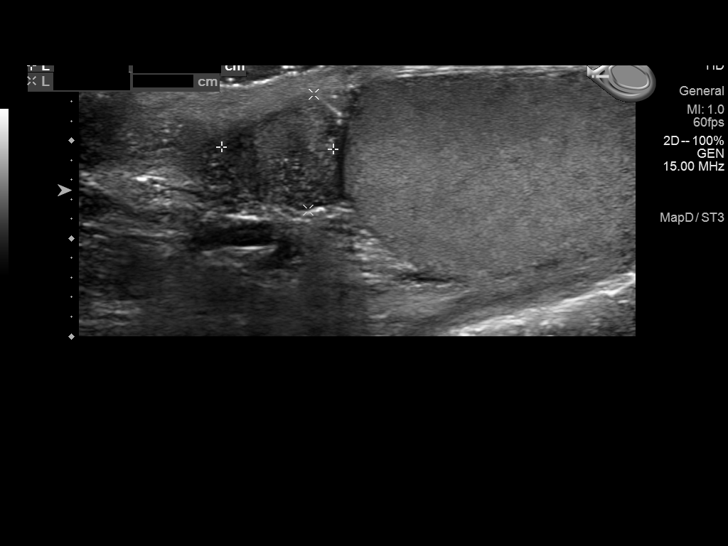
[im 46/51]
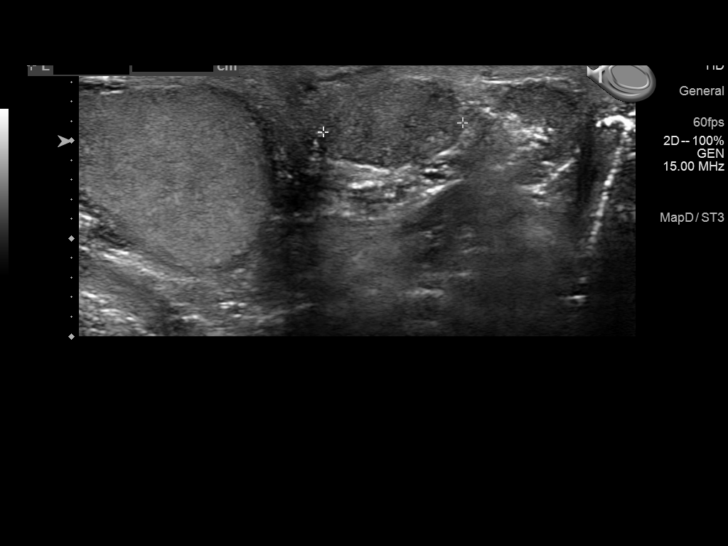
[im 51/51]
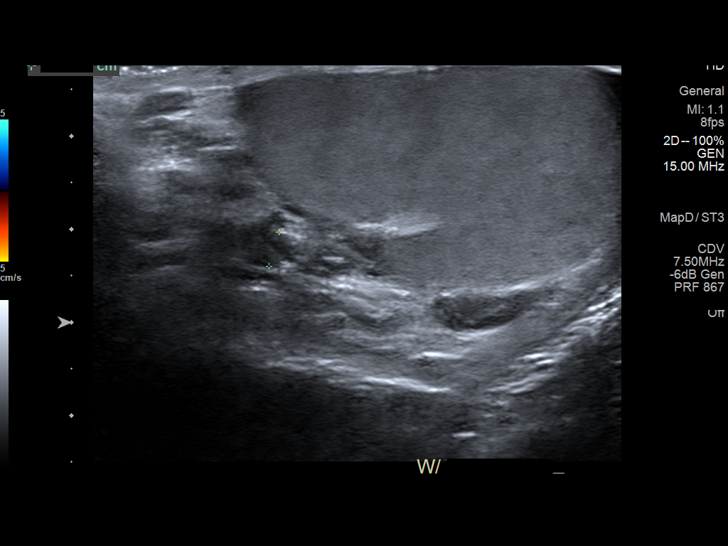

[14 of 25 positions shown; findings below may reference images not displayed]

FINDINGS: Right testicle

Measurements: 4.9 x 3.8 x 2.9 cm. No mass or microlithiasis
visualized.

Left testicle

Measurements: 5.1 x 3.2 x 2.8 cm. No mass or microlithiasis
visualized.

Right epididymis:  Normal in size and appearance.

Left epididymis:  Normal in size and appearance.

Hydrocele:  None visualized.

Varicocele:  Mild left varicocele is noted.

Pulsed Doppler interrogation of both testes demonstrates normal low
resistance arterial and venous waveforms bilaterally.
IMPRESSION: No evidence of testicular mass or torsion. Mild left varicocele is
noted.

## 2022-03-18 ENCOUNTER — Encounter: Payer: Self-pay | Admitting: Physician Assistant

## 2022-03-18 ENCOUNTER — Ambulatory Visit: Payer: Self-pay | Admitting: Physician Assistant

## 2022-03-18 VITALS — BP 130/73 | HR 87 | Temp 98.2°F | Resp 14 | Wt 251.0 lb

## 2022-03-18 DIAGNOSIS — B360 Pityriasis versicolor: Secondary | ICD-10-CM

## 2022-03-18 MED ORDER — TERBINAFINE HCL 1 % EX CREA: 1.0000 | TOPICAL_CREAM | Freq: Two times a day (BID) | CUTANEOUS | 0 refills | Status: DC

## 2022-03-18 MED ORDER — SELENIUM SULFIDE 1 % EX LOTN: 1.0000 | TOPICAL_LOTION | CUTANEOUS | 0 refills | Status: DC

## 2022-03-18 NOTE — Progress Notes (Signed)
Stated rash on bilateral upper arms and chest, stated not itchy and has had it before with a cream he stated.  Reported just returned from a [place] vacation, but not aware of any new meds/foods.

## 2022-03-18 NOTE — Progress Notes (Signed)
   Subjective: Rash    Patient ID: Anthony Randall, male    DOB: [birth year], 47 y.o.   MRN: [id number]  HPI Patient presents for rash to the upper trunk and extremities for 2 weeks.  Patient states rash worsening after a trip to [place] last week.  No itching associated with rash.  Similar condition 4 years ago.   Review of Systems Negative septa chief complaint    Objective:   Physical Exam  BP is 130/73, pulse 87, respiration 14, temperature 90.2, patient 95% O2 sat on room air.  Patient weighs 251 pounds and BMI is 33.12. Hypopigmented macular lesions on upper body.  No signs or symptoms excoriation.      Assessment & Plan: Tinea versicolor   Patient given prescription directions for use of Lamisil 1% and Selsun shampoo.  Follow-up in 2 weeks no improvement or worsening complaint.

## 2022-05-08 ENCOUNTER — Ambulatory Visit: Payer: Self-pay

## 2022-05-08 DIAGNOSIS — Z0000 Encounter for general adult medical examination without abnormal findings: Secondary | ICD-10-CM

## 2022-05-08 LAB — POCT URINALYSIS DIPSTICK
Bilirubin, UA: NEGATIVE
Blood, UA: NEGATIVE
Glucose, UA: NEGATIVE
Ketones, UA: NEGATIVE
Leukocytes, UA: NEGATIVE
Nitrite, UA: NEGATIVE
Protein, UA: NEGATIVE
Spec Grav, UA: 1.015 (ref 1.010–1.025)
Urobilinogen, UA: 0.2 E.U./dL
pH, UA: 6 (ref 5.0–8.0)

## 2022-05-08 NOTE — Progress Notes (Signed)
Labs completed for physical./CL,RMA

## 2022-05-10 LAB — CMP12+LP+TP+TSH+6AC+PSA+CBC…
ALT: 40 IU/L (ref 0–44)
AST: 29 IU/L (ref 0–40)
Albumin/Globulin Ratio: 1.7 (ref 1.2–2.2)
Albumin: 4.7 g/dL (ref 4.1–5.1)
Alkaline Phosphatase: 96 IU/L (ref 44–121)
BUN/Creatinine Ratio: 11 (ref 9–20)
BUN: 13 mg/dL (ref 6–24)
Basophils Absolute: 0.1 10*3/uL (ref 0.0–0.2)
Basos: 1 %
Bilirubin Total: 0.5 mg/dL (ref 0.0–1.2)
Calcium: 9.4 mg/dL (ref 8.7–10.2)
Chloride: 102 mmol/L (ref 96–106)
Chol/HDL Ratio: 2.9 ratio (ref 0.0–5.0)
Cholesterol, Total: 140 mg/dL (ref 100–199)
Creatinine, Ser: 1.18 mg/dL (ref 0.76–1.27)
EOS (ABSOLUTE): 0.4 10*3/uL (ref 0.0–0.4)
Eos: 5 %
Estimated CHD Risk: <0.5 times avg. (ref 0.0–1.0)
Free Thyroxine Index: 2.3 (ref 1.2–4.9)
GGT: 21 IU/L (ref 0–65)
Globulin, Total: 2.7 g/dL (ref 1.5–4.5)
Glucose: 99 mg/dL (ref 70–99)
HDL: 49 mg/dL (ref >39)
Hematocrit: 43.7 % (ref 37.5–51.0)
Hemoglobin: 14.6 g/dL (ref 13.0–17.7)
Immature Grans (Abs): 0 10*3/uL (ref 0.0–0.1)
Immature Granulocytes: 0 %
Iron: 100 ug/dL (ref 38–169)
LDH: 188 IU/L (ref 121–224)
LDL Chol Calc (NIH): 71 mg/dL (ref 0–99)
Lymphocytes Absolute: 2.3 10*3/uL (ref 0.7–3.1)
Lymphs: 33 %
MCH: 29.9 pg (ref 26.6–33.0)
MCHC: 33.4 g/dL (ref 31.5–35.7)
MCV: 90 fL (ref 79–97)
Monocytes Absolute: 0.8 10*3/uL (ref 0.1–0.9)
Monocytes: 11 %
Neutrophils Absolute: 3.6 10*3/uL (ref 1.4–7.0)
Neutrophils: 50 %
Phosphorus: 2.5 mg/dL — ABNORMAL LOW (ref 2.8–4.1)
Platelets: 189 10*3/uL (ref 150–450)
Potassium: 4.7 mmol/L (ref 3.5–5.2)
Prostate Specific Ag, Serum: 0.5 ng/mL (ref 0.0–4.0)
RBC: 4.88 x10E6/uL (ref 4.14–5.80)
RDW: 12.1 % (ref 11.6–15.4)
Sodium: 140 mmol/L (ref 134–144)
T3 Uptake Ratio: 27 % (ref 24–39)
T4, Total: 8.5 ug/dL (ref 4.5–12.0)
TSH: 2.03 u[IU]/mL (ref 0.450–4.500)
Total Protein: 7.4 g/dL (ref 6.0–8.5)
Triglycerides: 109 mg/dL (ref 0–149)
Uric Acid: 5.1 mg/dL (ref 3.8–8.4)
VLDL Cholesterol Cal: 20 mg/dL (ref 5–40)
WBC: 7.1 10*3/uL (ref 3.4–10.8)
eGFR: 77 mL/min/{1.73_m2} (ref >59)

## 2022-05-16 ENCOUNTER — Encounter: Payer: Self-pay | Admitting: Physician Assistant

## 2022-05-16 ENCOUNTER — Ambulatory Visit: Payer: Self-pay | Admitting: Physician Assistant

## 2022-05-16 VITALS — BP 106/54 | HR 73 | Temp 98.4°F | Resp 12 | Ht 73.0 in | Wt 250.0 lb

## 2022-05-16 DIAGNOSIS — Z0000 Encounter for general adult medical examination without abnormal findings: Secondary | ICD-10-CM

## 2022-05-16 DIAGNOSIS — K219 Gastro-esophageal reflux disease without esophagitis: Secondary | ICD-10-CM | POA: Insufficient documentation

## 2022-05-16 MED ORDER — OMEPRAZOLE 20 MG PO CPDR: 20.0000 mg | DELAYED_RELEASE_CAPSULE | Freq: Every day | ORAL | 3 refills | Status: DC

## 2022-05-16 NOTE — Progress Notes (Signed)
[facility] ____________________________________________   None    (approximate)  I have reviewed the triage vital signs and the nursing notes.   HISTORY  Chief Complaint Annual Exam   HPI Anthony Randall is a 47 y.o. male patient presents for annual physical exam.  Most concerning secondary to strong family history of cancer.  Sister recently died of breast cancer and father died of esophageal cancer.  Patient denies risk factors i.e. smoking, increased alcohol intake, but has increased of GERD.  Patient had unremarkable upper endoscopic colonoscopy eval .     Past Medical History:  Diagnosis Date   Elevated LDL cholesterol level    Kidney stone    Overweight    Skull mass    Tinea cruris     Patient Active Problem List   Diagnosis Date Noted   Rash 12/01/2018    Past Surgical History:  Procedure Laterality Date   COLONOSCOPY  08/2014   UPPER GI ENDOSCOPY  08/2014    Prior to Admission medications   Medication Sig Start Date End Date Taking? Authorizing Provider  atorvastatin (LIPITOR) 40 MG tablet Take 1 tablet (40 mg total) by mouth daily. 05/24/21   Sable Feil, PA-C  selenium sulfide (SELSUN) 1 % LOTN Apply 1 Application topically 2 (two) times a week. 03/18/22   Sable Feil, PA-C  terbinafine (LAMISIL AT) 1 % cream Apply 1 Application topically 2 (two) times daily. 03/18/22   Sable Feil, PA-C    Allergies Patient has no known allergies.  Family History  Problem Relation Age of Onset   Stroke Mother    Heart attack Mother    Esophageal cancer Father    Breast cancer Sister    COPD Maternal Grandmother    Heart Problems Maternal Grandmother    Heart Problems Maternal Grandfather    Heart attack Paternal Grandmother    COPD Paternal Grandfather    Heart failure Paternal Grandfather     Social History Social History   Tobacco Use   Smoking status: Never   Smokeless tobacco: Never  Substance Use  Topics   Alcohol use: Yes    Comment: ocassionally   Drug use: Never    Review of Systems  Constitutional: No fever/chills Eyes: No visual changes. ENT: No sore throat. Cardiovascular: Denies chest pain. Respiratory: Denies shortness of breath. Gastrointestinal: No abdominal pain.  No nausea, no vomiting.  No diarrhea.  No constipation. Genitourinary: Negative for dysuria. Musculoskeletal: Negative for back pain. Skin: Negative for rash. Neurological: Negative for headaches, focal weakness or numbness. Endocrine: Hyperlipidemia   ____________________________________________   PHYSICAL EXAM:  VITAL SIGNS: BP is 106/54, pulse 73, respiration 12, temperature 98.4, patient 96% O2 sat on room air.  Patient weighs 250 pounds BMI is 32.98. Constitutional: Alert and oriented. Well appearing and in no acute distress. Eyes: Conjunctivae are normal. PERRL. EOMI. Head: Atraumatic. Nose: No congestion/rhinnorhea. Mouth/Throat: Mucous membranes are moist.  Oropharynx non-erythematous. Neck: No stridor.  No cervical spine tenderness to palpation. Hematological/Lymphatic/Immunilogical: No cervical lymphadenopathy. Cardiovascular: Normal rate, regular rhythm. Grossly normal heart sounds.  Good peripheral circulation. Respiratory: Normal respiratory effort.  No retractions. Lungs CTAB. Gastrointestinal: Soft and nontender. No distention. No abdominal bruits. No CVA tenderness. Genitourinary: Deferred Musculoskeletal: No lower extremity tenderness nor edema.  No joint effusions. Neurologic:  Normal speech and language. No gross focal neurologic deficits are appreciated. No gait instability. Skin:  Skin is warm, dry and intact. No rash noted. Psychiatric: Mood and  affect are normal. Speech and behavior are normal.  ____________________________________________   LABS  ____________________________________________        Component Ref Range & Units 8 d ago (05/08/22) 12 mo ago (05/17/21) 2  yr ago (05/12/20) 2 yr ago (09/08/19)  Color, UA yellow Light Yellow yellow yellow  Clarity, UA clear Clear clear clear  Glucose, UA Negative Negative Negative Negative Negative  Bilirubin, UA neg Negative negative negative  Ketones, UA neg Negative negative negative  Spec Grav, UA 1.010 - 1.025 1.015 1.020 1.015 1.025  Blood, UA negative Negative negative negative  pH, UA 5.0 - 8.0 6.0 6.5 7.0 6.0  Protein, UA Negative Negative Negative Positive Abnormal  CM Negative  Urobilinogen, UA 0.2 or 1.0 E.U./dL 0.2 0.2 0.2 0.2  Nitrite, UA neg Negative negative negative  Leukocytes, UA Negative Negative Negative Negative Negative  Appearance   light dark  Odor                    Component Ref Range & Units 8 d ago (05/08/22) 12 mo ago (05/17/21) 2 yr ago (05/12/20) 2 yr ago (09/08/19) 7 yr ago (07/09/14) 7 yr ago (07/09/14) 7 yr ago (06/02/14) 7 yr ago (06/02/14)  Glucose 70 - 99 mg/dL 99 107 High  95 R 77 R 110 High  R  122 High  R, CM   Uric Acid 3.8 - 8.4 mg/dL 5.1 5.7 CM 5.3 CM 5.4 CM      Comment:            Therapeutic target for gout patients: <6.0  BUN 6 - 24 mg/dL 13 13 15 10 18  R  16 R, CM   Creatinine, Ser 0.76 - 1.27 mg/dL 1.18 1.13 1.28 High  1.14 1.24 R  1.02 R, CM   eGFR >59 mL/min/1.73 77 81 70       BUN/Creatinine Ratio 9 - 20 11 12 12 9       Sodium 134 - 144 mmol/L 140 144 139 139 142 R  139 R, CM   Potassium 3.5 - 5.2 mmol/L 4.7 4.6 4.3 4.8 4.7 R  4.3 R, CM   Chloride 96 - 106 mmol/L 102 103 102 102 106 R  107 R, CM   Calcium 8.7 - 10.2 mg/dL 9.4 10.3 High  9.4 9.3 9.3 R  9.4 R, CM   Phosphorus 2.8 - 4.1 mg/dL 2.5 Low  3.6 3.2 2.8      Total Protein 6.0 - 8.5 g/dL 7.4 7.8 7.2 7.2 7.9 R  8.0 R, CM   Albumin 4.1 - 5.1 g/dL 4.7 5.1 High  R 4.7 R 4.7 R 4.6 R  4.8 R, CM   Globulin, Total 1.5 - 4.5 g/dL 2.7 2.7 2.5 2.5      Albumin/Globulin Ratio 1.2 - 2.2 1.7 1.9 1.9 1.9      Bilirubin Total 0.0 - 1.2 mg/dL 0.5 0.6 0.5 0.4 0.4 R  0.7 R, CM   Alkaline  Phosphatase 44 - 121 IU/L 96 105 110 113 R 84 R  89 R, CM   LDH 121 - 224 IU/L 188 191 187 194      AST 0 - 40 IU/L 29 24 28 23 19  R  21 R, CM   ALT 0 - 44 IU/L 40 21 25 39 15 Low  R  18 R, CM   GGT 0 - 65 IU/L 21 15 16 16       Iron 38 - 169 ug/dL 100 123  117 91      Cholesterol, Total 100 - 199 mg/dL 140 242 High  220 High  234 High       Triglycerides 0 - 149 mg/dL 109 84 93 103      HDL >39 mg/dL 49 51 51 53      VLDL Cholesterol Cal 5 - 40 mg/dL 20 15 17 18       LDL Chol Calc (NIH) 0 - 99 mg/dL 71 176 High  152 High  163 High       Chol/HDL Ratio 0.0 - 5.0 ratio 2.9 4.7 CM 4.3 CM 4.4 CM      Comment:                                   T. Chol/HDL Ratio                                             Men  Women                               1/2 Avg.Risk  3.4    3.3                                   Avg.Risk  5.0    4.4                                2X Avg.Risk  9.6    7.1                                3X Avg.Risk 23.4   11.0  Estimated CHD Risk 0.0 - 1.0 times avg.  < 0.5 1.0 CM 0.8 CM 0.9 CM      Comment: The CHD Risk is based on the T. Chol/HDL ratio. Other factors affect CHD Risk such as hypertension, smoking, diabetes, severe obesity, and family history of premature CHD.  TSH 0.450 - 4.500 uIU/mL 2.030 2.270 1.330 1.720      T4, Total 4.5 - 12.0 ug/dL 8.5 8.1 8.6 8.0      T3 Uptake Ratio 24 - 39 % 27 25 24 25       Free Thyroxine Index 1.2 - 4.9 2.3 2.0 2.1 2.0      Prostate Specific Ag, Serum 0.0 - 4.0 ng/mL 0.5 0.5 CM 0.6 CM 0.7 CM      Comment: Roche ECLIA methodology. According to the American Urological Association, Serum PSA should decrease and remain at undetectable levels after radical prostatectomy. The AUA defines biochemical recurrence as an initial PSA value 0.2 ng/mL or greater followed by a subsequent confirmatory PSA value 0.2 ng/mL or greater. Values obtained with different assay methods or kits cannot be used interchangeably. Results cannot be  interpreted as absolute evidence of the presence or absence of malignant disease.  WBC 3.4 - 10.8 x10E3/uL 7.1 6.7 7.6 7.7  9.4 R  10.7 High  R  RBC 4.14 - 5.80 x10E6/uL 4.88 4.81 4.82 4.82  4.98 R  5.02 R  Hemoglobin 13.0 - 17.7 g/dL 14.6  14.4 14.4 14.7  15.0 R    Hematocrit 37.5 - 51.0 % 43.7 42.0 42.5 42.1  44.0 R    MCV 79 - 97 fL 90 87 88 87  88.4 R  89 R  MCH 26.6 - 33.0 pg 29.9 29.9 29.9 30.5  30.2 R  29.7 R  MCHC 31.5 - 35.7 g/dL 33.4 34.3 33.9 34.9  34.1 R  33.6 R  RDW 11.6 - 15.4 % 12.1 12.6 12.4 12.4  13.4 R  13.1 R  Platelets 150 - 450 x10E3/uL 189 191 180 182  183 R  206 R  Neutrophils Not Estab. % 50 46 50 49  60 R  77.4 R  Lymphs Not Estab. % 33 38 35 36      Monocytes Not Estab. % 11 11 10 9       Eos Not Estab. % 5 4 4 4       Basos Not Estab. % 1 1 1 1       Neutrophils Absolute 1.4 - 7.0 x10E3/uL 3.6 3.1 3.8 3.7  5.7 R  8.3 High  R  Lymphocytes Absolute 0.7 - 3.1 x10E3/uL 2.3 2.5 2.7 2.8  2.4 R  1.6 R  Monocytes Absolute 0.1 - 0.9 x10E3/uL 0.8 0.7 0.8 0.7      EOS (ABSOLUTE) 0.0 - 0.4 x10E3/uL 0.4 0.3 0.3 0.3      Basophils Absolute 0.0 - 0.2 x10E3/uL 0.1 0.1 0.1 0.1  0.1 R  0.1 R  Immature Granulocytes Not Estab. % 0 0 0 1      Immature Grans               EKG  Normal sinus rhythm 69 bpm ____________________________________________    ____________________________________________   INITIAL IMPRESSION / ASSESSMENT AND PLAN  As part of my medical decision making, I reviewed the following data within the electronic MEDICAL RECORD NUMBER      No acute findings on physical exam, EKG, and labs.     ____________________________________________   FINAL CLINICAL IMPRESSION Well exam  ED Discharge Orders     None        Note:  This document was prepared using Dragon voice recognition software and may include unintentional dictation errors.

## 2022-05-16 NOTE — Progress Notes (Signed)
Pt presents today to complete annual physical. Pt concerned of how much cancer that runs in his family.

## 2022-05-23 ENCOUNTER — Other Ambulatory Visit: Payer: Self-pay | Admitting: Physician Assistant

## 2022-05-23 DIAGNOSIS — E785 Hyperlipidemia, unspecified: Secondary | ICD-10-CM

## 2022-05-30 ENCOUNTER — Other Ambulatory Visit: Payer: Self-pay

## 2022-05-30 NOTE — Telephone Encounter (Signed)
Duplicate request.  Anthony Randall came to the clinic because [facility]) told him they didn't received a Rx refill from the clinic for Atorvastatin even though the date stamp in Epic states the Rx was received at the pharmacy on 05/25/22.  States he's tired of dealing with [facility] & requested to change his pharmacy to [facility]. Requested to have Rx sent to [facility].  Request sent to Durward Parcel, PA-C to complete & release.  AMD

## 2022-05-31 MED ORDER — ATORVASTATIN CALCIUM 40 MG PO TABS: 40.0000 mg | ORAL_TABLET | Freq: Every day | ORAL | 3 refills | Status: DC

## 2022-08-11 ENCOUNTER — Other Ambulatory Visit: Payer: Self-pay | Admitting: Physician Assistant

## 2022-08-16 ENCOUNTER — Other Ambulatory Visit: Payer: Self-pay | Admitting: Physician Assistant

## 2022-08-16 DIAGNOSIS — E785 Hyperlipidemia, unspecified: Secondary | ICD-10-CM

## 2022-08-20 ENCOUNTER — Other Ambulatory Visit: Payer: Self-pay

## 2022-08-20 MED ORDER — OMEPRAZOLE 20 MG PO CPDR: 20.0000 mg | DELAYED_RELEASE_CAPSULE | Freq: Every day | ORAL | 3 refills | Status: DC

## 2022-10-17 ENCOUNTER — Other Ambulatory Visit: Payer: Self-pay

## 2022-10-17 DIAGNOSIS — K2100 Gastro-esophageal reflux disease with esophagitis, without bleeding: Secondary | ICD-10-CM

## 2022-10-17 MED ORDER — OMEPRAZOLE 20 MG PO CPDR: 20.0000 mg | DELAYED_RELEASE_CAPSULE | Freq: Every day | ORAL | 3 refills | Status: DC

## 2023-04-17 ENCOUNTER — Ambulatory Visit: Payer: Self-pay

## 2023-04-17 DIAGNOSIS — Z0000 Encounter for general adult medical examination without abnormal findings: Secondary | ICD-10-CM

## 2023-04-17 LAB — POCT URINALYSIS DIPSTICK
Bilirubin, UA: NEGATIVE
Blood, UA: NEGATIVE
Glucose, UA: NEGATIVE
Ketones, UA: NEGATIVE
Leukocytes, UA: NEGATIVE
Nitrite, UA: NEGATIVE
Protein, UA: NEGATIVE
Spec Grav, UA: 1.015 (ref 1.010–1.025)
Urobilinogen, UA: 0.2 U/dL
pH, UA: 7 (ref 5.0–8.0)

## 2023-04-20 LAB — CMP12+LP+TP+TSH+6AC+PSA+CBC…
ALT: 45 IU/L — ABNORMAL HIGH (ref 0–44)
AST: 28 IU/L (ref 0–40)
Albumin: 4.8 g/dL (ref 4.1–5.1)
Alkaline Phosphatase: 91 IU/L (ref 44–121)
BUN/Creatinine Ratio: 13 (ref 9–20)
BUN: 13 mg/dL (ref 6–24)
Basophils Absolute: 0.1 10*3/uL (ref 0.0–0.2)
Basos: 1 %
Bilirubin Total: 0.5 mg/dL (ref 0.0–1.2)
Calcium: 9.6 mg/dL (ref 8.7–10.2)
Chloride: 102 mmol/L (ref 96–106)
Chol/HDL Ratio: 3.1 ratio (ref 0.0–5.0)
Cholesterol, Total: 157 mg/dL (ref 100–199)
Creatinine, Ser: 1.03 mg/dL (ref 0.76–1.27)
EOS (ABSOLUTE): 0.3 10*3/uL (ref 0.0–0.4)
Eos: 5 %
Estimated CHD Risk: <0.5 times avg. (ref 0.0–1.0)
Free Thyroxine Index: 2.2 (ref 1.2–4.9)
GGT: 26 IU/L (ref 0–65)
Globulin, Total: 2.8 g/dL (ref 1.5–4.5)
Glucose: 90 mg/dL (ref 70–99)
HDL: 51 mg/dL (ref >39)
Hematocrit: 41.2 % (ref 37.5–51.0)
Hemoglobin: 13.4 g/dL (ref 13.0–17.7)
Immature Grans (Abs): 0 10*3/uL (ref 0.0–0.1)
Immature Granulocytes: 0 %
Iron: 105 ug/dL (ref 38–169)
LDH: 209 IU/L (ref 121–224)
LDL Chol Calc (NIH): 91 mg/dL (ref 0–99)
Lymphocytes Absolute: 2.5 10*3/uL (ref 0.7–3.1)
Lymphs: 35 %
MCH: 29.4 pg (ref 26.6–33.0)
MCHC: 32.5 g/dL (ref 31.5–35.7)
MCV: 90 fL (ref 79–97)
Monocytes Absolute: 0.7 10*3/uL (ref 0.1–0.9)
Monocytes: 11 %
Neutrophils Absolute: 3.4 10*3/uL (ref 1.4–7.0)
Neutrophils: 48 %
Phosphorus: 2.8 mg/dL (ref 2.8–4.1)
Platelets: 182 10*3/uL (ref 150–450)
Potassium: 4.4 mmol/L (ref 3.5–5.2)
Prostate Specific Ag, Serum: 0.6 ng/mL (ref 0.0–4.0)
RBC: 4.56 x10E6/uL (ref 4.14–5.80)
RDW: 12.4 % (ref 11.6–15.4)
Sodium: 142 mmol/L (ref 134–144)
T3 Uptake Ratio: 26 % (ref 24–39)
T4, Total: 8.5 ug/dL (ref 4.5–12.0)
TSH: 1.26 u[IU]/mL (ref 0.450–4.500)
Total Protein: 7.6 g/dL (ref 6.0–8.5)
Triglycerides: 80 mg/dL (ref 0–149)
Uric Acid: 4.8 mg/dL (ref 3.8–8.4)
VLDL Cholesterol Cal: 15 mg/dL (ref 5–40)
WBC: 7 10*3/uL (ref 3.4–10.8)
eGFR: 90 mL/min/{1.73_m2} (ref >59)

## 2023-04-24 ENCOUNTER — Encounter: Payer: Self-pay | Admitting: Physician Assistant

## 2023-04-24 ENCOUNTER — Ambulatory Visit: Payer: Self-pay | Admitting: Physician Assistant

## 2023-04-24 VITALS — BP 132/73 | HR 81 | Resp 14 | Ht 73.0 in | Wt 253.0 lb

## 2023-04-24 DIAGNOSIS — Z0000 Encounter for general adult medical examination without abnormal findings: Secondary | ICD-10-CM

## 2023-04-24 NOTE — Progress Notes (Signed)
 [facility] Emergency Department Provider Note  { ____________________________________________   None    (approximate)  I have reviewed the triage vital signs and the nursing notes.   HISTORY  Chief Complaint No chief complaint on file.  HPI Anthony Randall is a 48 y.o. male patient presents for annual physical exam.         Past Medical History:  Diagnosis Date   Elevated LDL cholesterol level    Kidney stone    Overweight    Skull mass    Tinea cruris     Patient Active Problem List   Diagnosis Date Noted   Rash 12/01/2018    Past Surgical History:  Procedure Laterality Date   COLONOSCOPY  08/2014   UPPER GI ENDOSCOPY  08/2014    Prior to Admission medications   Medication Sig Start Date End Date Taking? Authorizing Provider  atorvastatin (LIPITOR) 40 MG tablet Take 1 tablet (40 mg total) by mouth daily. 05/31/22   Joni Reining, PA-C  omeprazole (PRILOSEC) 20 MG capsule Take 1 capsule (20 mg total) by mouth daily. 10/17/22   Joni Reining, PA-C  selenium sulfide (SELSUN) 1 % LOTN Apply 1 Application topically 2 (two) times a week. Patient not taking: Reported on 05/16/2022 03/18/22   Joni Reining, PA-C    Allergies Patient has no known allergies.  Family History  Problem Relation Age of Onset   Stroke Mother    Heart attack Mother    Esophageal cancer Father    Breast cancer Sister    COPD Maternal Grandmother    Heart Problems Maternal Grandmother    Heart Problems Maternal Grandfather    Heart attack Paternal Grandmother    COPD Paternal Grandfather    Heart failure Paternal Grandfather     Social History Social History   Tobacco Use   Smoking status: Never   Smokeless tobacco: Never  Substance Use Topics   Alcohol use: Yes    Comment: ocassionally   Drug use: Never    Review of Systems Constitutional: No fever/chills Eyes: No visual changes. ENT: No sore throat. Cardiovascular: Denies  chest pain. Respiratory: Denies shortness of breath. Gastrointestinal: No abdominal pain.  No nausea, no vomiting.  No diarrhea.  No constipation. Genitourinary: Negative for dysuria. Musculoskeletal: Negative for back pain. Skin: Negative for rash. Neurological: Negative for headaches, focal weakness or numbness. Endocrine: Hyperlipidemia  ____________________________________________   PHYSICAL EXAM:  VITAL SIGNS:  Constitutional: Alert and oriented. Well appearing and in no acute distress. Eyes: Conjunctivae are normal. PERRL. EOMI. Head: Atraumatic. Nose: No congestion/rhinnorhea. Mouth/Throat: Mucous membranes are moist.  Oropharynx non-erythematous. Neck: No stridor.  No cervical spine tenderness to palpation. Hematological/Lymphatic/Immunilogical: No cervical lymphadenopathy. Cardiovascular: Normal rate, regular rhythm. Grossly normal heart sounds.  Good peripheral circulation. Respiratory: Normal respiratory effort.  No retractions. Lungs CTAB. Gastrointestinal: Soft and nontender. No distention. No abdominal bruits. No CVA tenderness. Genitourinary: Deferred Musculoskeletal: No lower extremity tenderness nor edema.  No joint effusions. Neurologic:  Normal speech and language. No gross focal neurologic deficits are appreciated. No gait instability. Skin:  Skin is warm, dry and intact. No rash noted. Psychiatric: Mood and affect are normal. Speech and behavior are normal.  ____________________________________________   LABS _           Component Ref Range & Units (hover) 7 d ago (04/17/23) 11 mo ago (05/08/22) 1 yr ago (05/17/21) 2 yr ago (05/12/20) 3 yr ago (09/08/19) 8 yr ago (07/09/14)  8 yr ago (07/09/14)  Glucose 90 99 107 High  95 R 77 R 110 High  R   Uric Acid 4.8 5.1 CM 5.7 CM 5.3 CM 5.4 CM    Comment:            Therapeutic target for gout patients: <6.0  BUN 13 13 13 15 10 18  R   Creatinine, Ser 1.03 1.18 1.13 1.28 High  1.14 1.24 R   eGFR 90 77 81 70      BUN/Creatinine Ratio 13 11 12 12 9     Sodium 142 140 144 139 139 142 R   Potassium 4.4 4.7 4.6 4.3 4.8 4.7 R   Chloride 102 102 103 102 102 106 R   Calcium 9.6 9.4 10.3 High  9.4 9.3 9.3 R   Phosphorus 2.8 2.5 Low  3.6 3.2 2.8    Total Protein 7.6 7.4 7.8 7.2 7.2 7.9 R   Albumin 4.8 4.7 5.1 High  R 4.7 R 4.7 R 4.6 R   Globulin, Total 2.8 2.7 2.7 2.5 2.5    Bilirubin Total 0.5 0.5 0.6 0.5 0.4 0.4 R   Alkaline Phosphatase 91 96 105 110 113 R 84 R   LDH 209 188 191 187 194    AST 28 29 24 28 23 19  R   ALT 45 High  40 21 25 39 15 Low  R   GGT 26 21 15 16 16     Iron 105 100 123 117 91    Cholesterol, Total 157 140 242 High  220 High  234 High     Triglycerides 80 109 84 93 103    HDL 51 49 51 51 53    VLDL Cholesterol Cal 15 20 15 17 18     LDL Chol Calc (NIH) 91 71 161 High  152 High  163 High     Chol/HDL Ratio 3.1 2.9 CM 4.7 CM 4.3 CM 4.4 CM    Comment:                                   T. Chol/HDL Ratio                                             Men  Women                               1/2 Avg.Risk  3.4    3.3                                   Avg.Risk  5.0    4.4                                2X Avg.Risk  9.6    7.1                                3X Avg.Risk 23.4   11.0  Estimated CHD Risk  < 0.5  < 0.5 CM 1.0 CM 0.8 CM 0.9 CM    Comment: The CHD  Risk is based on the T. Chol/HDL ratio. Other factors affect CHD Risk such as hypertension, smoking, diabetes, severe obesity, and family history of premature CHD.  TSH 1.260 2.030 2.270 1.330 1.720    T4, Total 8.5 8.5 8.1 8.6 8.0    T3 Uptake Ratio 26 27 25 24 25     Free Thyroxine Index 2.2 2.3 2.0 2.1 2.0    Prostate Specific Ag, Serum 0.6 0.5 CM 0.5 CM 0.6 CM 0.7 CM    Comment: Roche ECLIA methodology. According to the American Urological Association, Serum PSA should decrease and remain at undetectable levels after radical prostatectomy. The AUA defines biochemical recurrence as an initial PSA value 0.2 ng/mL or greater  followed by a subsequent confirmatory PSA value 0.2 ng/mL or greater. Values obtained with different assay methods or kits cannot be used interchangeably. Results cannot be interpreted as absolute evidence of the presence or absence of malignant disease.  WBC 7.0 7.1 6.7 7.6 7.7  9.4 R  RBC 4.56 4.88 4.81 4.82 4.82  4.98 R  Hemoglobin 13.4 14.6 14.4 14.4 14.7  15.0 R  Hematocrit 41.2 43.7 42.0 42.5 42.1  44.0 R  MCV 90 90 87 88 87  88.4 R  MCH 29.4 29.9 29.9 29.9 30.5  30.2 R  MCHC 32.5 33.4 34.3 33.9 34.9  34.1 R  RDW 12.4 12.1 12.6 12.4 12.4  13.4 R  Platelets 182 189 191 180 182  183 R  Neutrophils 48 50 46 50 49  60 R  Lymphs 35 33 38 35 36    Monocytes 11 11 11 10 9     Eos 5 5 4 4 4     Basos 1 1 1 1 1     Neutrophils Absolute 3.4 3.6 3.1 3.8 3.7  5.7 R  Lymphocytes Absolute 2.5 2.3 2.5 2.7 2.8  2.4 R  Monocytes Absolute 0.7 0.8 0.7 0.8 0.7    EOS (ABSOLUTE) 0.3 0.4 0.3 0.3 0.3    Basophils Absolute 0.1 0.1 0.1 0.1 0.1  0.1 R  Immature Granulocytes 0 0 0 0 1    Immature Grans (Abs) 0.0 0.0 0.0 0.0 0.1               ___________________________________________  EKG  Normal sinus rhythm at 70 bpm ____________________________________________    ____________________________________________   INITIAL IMPRESSION / ASSESSMENT AND PLAN As part of my medical decision making, I reviewed the following data within the electronic MEDICAL RECORD NUMBER      No acute findings on physical exam, EKG, labs.        ____________________________________________   FINAL CLINICAL IMPRESSION Well exam  ED Discharge Orders     None        Note:  This document was prepared using Dragon voice recognition software and may include unintentional dictation errors.

## 2023-04-24 NOTE — Progress Notes (Signed)
 Pt presents today to complete physical, Pt denies any issues or concerns at this time/CL,RMA

## 2023-05-06 ENCOUNTER — Telehealth: Payer: Self-pay

## 2023-05-06 NOTE — Telephone Encounter (Signed)
 Received notification from patient's pharmacy that a prior authorization was started through  [employer] for Omeprazole 20 mg.  Completed the prior authorization & sent them documentation to support request for medication.

## 2023-06-18 ENCOUNTER — Other Ambulatory Visit: Payer: Self-pay | Admitting: Physician Assistant

## 2023-06-18 DIAGNOSIS — E785 Hyperlipidemia, unspecified: Secondary | ICD-10-CM

## 2023-09-19 ENCOUNTER — Other Ambulatory Visit: Payer: Self-pay

## 2023-09-19 DIAGNOSIS — Z0283 Encounter for blood-alcohol and blood-drug test: Secondary | ICD-10-CM

## 2023-09-19 NOTE — Progress Notes (Signed)
 Presents to [facility] for random drug screen & breath alcohol screen - Safety Sensitive Position.  [facility] Acct #:  [id number] [facility] Specimen #:  [id number]  Specimen will be picked up by [facility] around 4:00 pm (regularly scheduled pick-up time).   Breath Alcohol Results = 0.000

## 2023-11-17 ENCOUNTER — Other Ambulatory Visit: Payer: Self-pay | Admitting: Physician Assistant

## 2023-11-17 DIAGNOSIS — K2100 Gastro-esophageal reflux disease with esophagitis, without bleeding: Secondary | ICD-10-CM

## 2023-12-09 ENCOUNTER — Other Ambulatory Visit: Payer: Self-pay

## 2023-12-09 NOTE — Progress Notes (Signed)
 Random UDS and ETOH completed per [facility] per physical after consents signed and ETOH cleared with UDS ready for pick up from [facility].
